# Patient Record
Sex: Female | Born: 1970 | Race: White | Hispanic: No | Marital: Married | Smoking: Never smoker
Health system: Southern US, Community
[De-identification: ages and names within clinical notes are randomized; demographics above are authoritative.]

## PROBLEM LIST (undated history)

## (undated) DIAGNOSIS — F419 Anxiety disorder, unspecified: Secondary | ICD-10-CM

## (undated) DIAGNOSIS — T4145XA Adverse effect of unspecified anesthetic, initial encounter: Secondary | ICD-10-CM

## (undated) DIAGNOSIS — J329 Chronic sinusitis, unspecified: Secondary | ICD-10-CM

## (undated) DIAGNOSIS — R51 Headache: Secondary | ICD-10-CM

## (undated) DIAGNOSIS — J45909 Unspecified asthma, uncomplicated: Secondary | ICD-10-CM

## (undated) DIAGNOSIS — K5090 Crohn's disease, unspecified, without complications: Secondary | ICD-10-CM

## (undated) DIAGNOSIS — T8859XA Other complications of anesthesia, initial encounter: Secondary | ICD-10-CM

## (undated) HISTORY — PX: VARICOSE VEIN SURGERY: SHX832

---

## 1998-02-21 ENCOUNTER — Ambulatory Visit (HOSPITAL_COMMUNITY): Admission: RE | Admit: 1998-02-21 | Discharge: 1998-02-21 | Payer: Self-pay | Admitting: Gastroenterology

## 1998-02-22 ENCOUNTER — Ambulatory Visit (HOSPITAL_COMMUNITY): Admission: RE | Admit: 1998-02-22 | Discharge: 1998-02-22 | Payer: Self-pay | Admitting: Gastroenterology

## 2002-02-01 ENCOUNTER — Other Ambulatory Visit: Admission: RE | Admit: 2002-02-01 | Discharge: 2002-02-01 | Payer: Self-pay | Admitting: Family Medicine

## 2004-10-30 ENCOUNTER — Other Ambulatory Visit: Admission: RE | Admit: 2004-10-30 | Discharge: 2004-10-30 | Payer: Self-pay | Admitting: Obstetrics and Gynecology

## 2005-03-07 ENCOUNTER — Inpatient Hospital Stay (HOSPITAL_COMMUNITY): Admission: AD | Admit: 2005-03-07 | Discharge: 2005-03-07 | Payer: Self-pay | Admitting: Obstetrics and Gynecology

## 2005-06-02 ENCOUNTER — Ambulatory Visit (HOSPITAL_COMMUNITY): Admission: RE | Admit: 2005-06-02 | Discharge: 2005-06-02 | Payer: Self-pay | Admitting: Obstetrics and Gynecology

## 2005-06-02 ENCOUNTER — Inpatient Hospital Stay (HOSPITAL_COMMUNITY): Admission: AD | Admit: 2005-06-02 | Discharge: 2005-06-02 | Payer: Self-pay | Admitting: Obstetrics and Gynecology

## 2005-06-06 ENCOUNTER — Inpatient Hospital Stay (HOSPITAL_COMMUNITY): Admission: AD | Admit: 2005-06-06 | Discharge: 2005-06-12 | Payer: Self-pay | Admitting: Obstetrics and Gynecology

## 2005-06-09 ENCOUNTER — Encounter (INDEPENDENT_AMBULATORY_CARE_PROVIDER_SITE_OTHER): Payer: Self-pay | Admitting: Specialist

## 2005-07-09 ENCOUNTER — Inpatient Hospital Stay (HOSPITAL_COMMUNITY): Admission: AD | Admit: 2005-07-09 | Discharge: 2005-07-09 | Payer: Self-pay | Admitting: Obstetrics and Gynecology

## 2006-04-30 ENCOUNTER — Other Ambulatory Visit: Admission: RE | Admit: 2006-04-30 | Discharge: 2006-04-30 | Payer: Self-pay | Admitting: Obstetrics and Gynecology

## 2006-10-07 ENCOUNTER — Encounter: Admission: RE | Admit: 2006-10-07 | Discharge: 2006-10-07 | Payer: Self-pay | Admitting: Family Medicine

## 2008-02-13 ENCOUNTER — Emergency Department (HOSPITAL_COMMUNITY): Admission: EM | Admit: 2008-02-13 | Discharge: 2008-02-13 | Payer: Self-pay | Admitting: Emergency Medicine

## 2008-02-14 ENCOUNTER — Encounter: Payer: Self-pay | Admitting: Pulmonary Disease

## 2008-02-14 ENCOUNTER — Encounter: Admission: RE | Admit: 2008-02-14 | Discharge: 2008-02-14 | Payer: Self-pay | Admitting: Family Medicine

## 2008-02-17 ENCOUNTER — Telehealth (INDEPENDENT_AMBULATORY_CARE_PROVIDER_SITE_OTHER): Payer: Self-pay | Admitting: *Deleted

## 2008-02-17 ENCOUNTER — Ambulatory Visit: Payer: Self-pay | Admitting: Pulmonary Disease

## 2008-02-17 DIAGNOSIS — J309 Allergic rhinitis, unspecified: Secondary | ICD-10-CM | POA: Insufficient documentation

## 2008-02-17 DIAGNOSIS — J383 Other diseases of vocal cords: Secondary | ICD-10-CM | POA: Insufficient documentation

## 2008-02-17 DIAGNOSIS — K5090 Crohn's disease, unspecified, without complications: Secondary | ICD-10-CM | POA: Insufficient documentation

## 2008-02-17 DIAGNOSIS — J45909 Unspecified asthma, uncomplicated: Secondary | ICD-10-CM | POA: Insufficient documentation

## 2008-02-18 ENCOUNTER — Telehealth (INDEPENDENT_AMBULATORY_CARE_PROVIDER_SITE_OTHER): Payer: Self-pay | Admitting: *Deleted

## 2008-02-18 ENCOUNTER — Encounter: Payer: Self-pay | Admitting: Pulmonary Disease

## 2008-02-21 ENCOUNTER — Telehealth: Payer: Self-pay | Admitting: Pulmonary Disease

## 2008-02-28 ENCOUNTER — Encounter: Admission: RE | Admit: 2008-02-28 | Discharge: 2008-04-12 | Payer: Self-pay | Admitting: Pulmonary Disease

## 2008-02-28 ENCOUNTER — Encounter: Payer: Self-pay | Admitting: Pulmonary Disease

## 2008-02-29 ENCOUNTER — Ambulatory Visit: Payer: Self-pay | Admitting: Pulmonary Disease

## 2008-03-03 ENCOUNTER — Telehealth (INDEPENDENT_AMBULATORY_CARE_PROVIDER_SITE_OTHER): Payer: Self-pay | Admitting: *Deleted

## 2008-03-14 ENCOUNTER — Telehealth: Payer: Self-pay | Admitting: Pulmonary Disease

## 2008-03-16 ENCOUNTER — Telehealth (INDEPENDENT_AMBULATORY_CARE_PROVIDER_SITE_OTHER): Payer: Self-pay | Admitting: *Deleted

## 2008-03-29 ENCOUNTER — Encounter: Admission: RE | Admit: 2008-03-29 | Discharge: 2008-03-29 | Payer: Self-pay | Admitting: Obstetrics and Gynecology

## 2008-04-04 ENCOUNTER — Ambulatory Visit: Payer: Self-pay | Admitting: Pulmonary Disease

## 2008-04-12 ENCOUNTER — Encounter: Payer: Self-pay | Admitting: Pulmonary Disease

## 2008-05-04 ENCOUNTER — Telehealth: Payer: Self-pay | Admitting: Pulmonary Disease

## 2008-06-27 ENCOUNTER — Encounter: Payer: Self-pay | Admitting: Pulmonary Disease

## 2008-07-04 ENCOUNTER — Ambulatory Visit: Payer: Self-pay | Admitting: Pulmonary Disease

## 2008-11-02 ENCOUNTER — Ambulatory Visit: Payer: Self-pay | Admitting: Pulmonary Disease

## 2008-11-03 ENCOUNTER — Ambulatory Visit (HOSPITAL_COMMUNITY): Admission: RE | Admit: 2008-11-03 | Discharge: 2008-11-03 | Payer: Self-pay | Admitting: Family Medicine

## 2009-01-17 ENCOUNTER — Ambulatory Visit: Payer: Self-pay | Admitting: Pulmonary Disease

## 2009-01-22 ENCOUNTER — Ambulatory Visit: Payer: Self-pay | Admitting: Vascular Surgery

## 2009-03-08 ENCOUNTER — Telehealth: Payer: Self-pay | Admitting: Pulmonary Disease

## 2009-04-02 ENCOUNTER — Ambulatory Visit: Payer: Self-pay | Admitting: Vascular Surgery

## 2009-05-02 ENCOUNTER — Ambulatory Visit (HOSPITAL_COMMUNITY): Admission: RE | Admit: 2009-05-02 | Discharge: 2009-05-02 | Payer: Self-pay | Admitting: Allergy and Immunology

## 2009-05-21 ENCOUNTER — Ambulatory Visit: Payer: Self-pay | Admitting: Vascular Surgery

## 2009-05-29 ENCOUNTER — Ambulatory Visit: Payer: Self-pay | Admitting: Vascular Surgery

## 2009-06-18 ENCOUNTER — Ambulatory Visit: Payer: Self-pay | Admitting: Vascular Surgery

## 2009-06-25 ENCOUNTER — Ambulatory Visit: Payer: Self-pay | Admitting: Vascular Surgery

## 2009-07-26 ENCOUNTER — Ambulatory Visit: Payer: Self-pay | Admitting: Vascular Surgery

## 2009-08-23 ENCOUNTER — Ambulatory Visit: Payer: Self-pay | Admitting: Vascular Surgery

## 2009-09-08 HISTORY — PX: SEPTOPLASTY: SHX2393

## 2009-10-18 ENCOUNTER — Ambulatory Visit: Payer: Self-pay | Admitting: Vascular Surgery

## 2009-10-24 ENCOUNTER — Ambulatory Visit (HOSPITAL_COMMUNITY): Admission: RE | Admit: 2009-10-24 | Discharge: 2009-10-24 | Payer: Self-pay | Admitting: Gastroenterology

## 2009-10-31 ENCOUNTER — Ambulatory Visit (HOSPITAL_COMMUNITY): Admission: RE | Admit: 2009-10-31 | Discharge: 2009-10-31 | Payer: Self-pay | Admitting: Gastroenterology

## 2009-11-14 ENCOUNTER — Ambulatory Visit (HOSPITAL_COMMUNITY): Admission: RE | Admit: 2009-11-14 | Discharge: 2009-11-14 | Payer: Self-pay | Admitting: Gastroenterology

## 2009-12-12 ENCOUNTER — Encounter (HOSPITAL_COMMUNITY): Admission: RE | Admit: 2009-12-12 | Discharge: 2010-03-12 | Payer: Self-pay | Admitting: Gastroenterology

## 2010-01-02 ENCOUNTER — Telehealth: Payer: Self-pay | Admitting: Pulmonary Disease

## 2010-01-14 ENCOUNTER — Telehealth: Payer: Self-pay | Admitting: Adult Health

## 2010-03-22 ENCOUNTER — Encounter (HOSPITAL_COMMUNITY): Admission: RE | Admit: 2010-03-22 | Discharge: 2010-04-05 | Payer: Self-pay | Admitting: Gastroenterology

## 2010-05-09 ENCOUNTER — Ambulatory Visit (HOSPITAL_COMMUNITY): Admission: RE | Admit: 2010-05-09 | Discharge: 2010-05-09 | Payer: Self-pay | Admitting: Gastroenterology

## 2010-10-08 NOTE — Progress Notes (Signed)
Summary: Alprazolam   Phone Note Refill Request   Refills Requested: Medication #1:  ALPRAZOLAM 0.25 MG TABS Take 1 tablet by mouth three times a day as needed   Last Refilled: 07/06/2009 Per Dr. Vassie Loll ok for refill this time with 0 refills, pt will need to arrange OV with TP for follow up  Initial call taken by: Zackery Barefoot CMA,  January 02, 2010 4:45 PM  Follow-up for Phone Call        Rx called to pharmacy Zackery Barefoot CMA  January 02, 2010 4:47 PM   ATC pt on home # no answer and unable to leave message. Will try again. Zackery Barefoot CMA  January 02, 2010 4:55 PM     Prescriptions: ALPRAZOLAM 0.25 MG TABS (ALPRAZOLAM) Take 1 tablet by mouth three times a day as needed  #90 x 0   Entered by:   Zackery Barefoot CMA   Authorized by:   Comer Locket. Vassie Loll MD   Signed by:   Zackery Barefoot CMA on 01/02/2010   Method used:   Telephoned to ...       [facility]* (retail)       [address]       [address] Shipping/mailing       [place], [place]  [address]       Ph: [phone]       Fax: [phone]   RxID:   [id number]   Appended Document: Alprazolam  Pt needs follow up appointment with TP.   Appended Document: Alprazolam  appointment scheduled

## 2010-10-08 NOTE — Progress Notes (Signed)
Summary: nos appt  Phone Note Call from Patient   Caller: Stacie Kaufman  Call For: Stacie Kaufman Summary of Call: LMTCB x 2 for nos 5/6 appt. Initial call taken by: Darletta Moll,  Jan 14, 2010 3:52 PM

## 2011-01-21 NOTE — Procedures (Signed)
DUPLEX DEEP VENOUS EXAM - LOWER EXTREMITY   INDICATION:  Follow up right greater saphenous vein graft laser  ablation.   HISTORY:  Edema:  No.  Trauma/Surgery:  Right greater saphenous vein graft laser ablation on  May 21, 2009.  Pain:  Right mid thigh tenderness.  PE:  No.  Previous DVT:  No.  Anticoagulants:  Other:   DUPLEX EXAM:                CFV   SFV   PopV  PTV    GSV                R  L  R  L  R  L  R   L  R  L  Thrombosis    o  o  o     o     O      +  Spontaneous   +  +  +     +     +      o  Phasic        +  +  +     +     +      o  Augmentation  +  +  +     +     +      o  Compressible  +  +  +     +     +      o  Competent     +  +  +     +     +      +   Legend:  + - yes  o - no  p - partial  D - decreased   IMPRESSION:  1. No evidence of deep venous thrombosis noted in the right lower      extremity.  2. Total occlusion of the right greater saphenous vein graft from the      distal insertion site to the proximal thigh level and at the mid      calf level.  3. Partial occlusion of the right greater saphenous vein graft noted      at the saphenofemoral junction level with thrombus extending 2 mm      into the common femoral vein lumen.  4. The right saphenofemoral junction is competent.    _____________________________  Quita Skye. Hart Rochester, M.D.   CH/MEDQ  D:  05/30/2009  T:  05/30/2009  Job:  [id number]

## 2011-01-21 NOTE — Assessment & Plan Note (Signed)
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  She has had 1 pregnancy 3-1/2 years ago and developed  thrombophlebitis of the right leg towards the end of her pregnancy and  also had recurrent thrombophlebitis a few months ago.  She has painful  varicosities in both legs involving the lower thigh and calf both  medially and laterally.  She has been treated with long-leg graduated  compression elastic stockings for the last 3-1/2 months as well as  elevation of her legs and ibuprofen on a daily basis.  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  She desires to be pregnant again but is concerned about further episodes  of DVT or phlebitis because of her history.   On examination, she continues to have bulging varicosities in the right  leg in the medial thigh and calf, in the lateral calf as well as in the  left pretibial region.  She has gross reflux in both great saphenous  veins from the saphenofemoral junction to the knee with normal deep  venous systems.   Because she has failed conservative treatment and continues to have  symptoms which are affecting her working and her daily living and she  wishes to get pregnant again, we think the best plan would be the  following schedule:  1)  Laser ablation of the right great saphenous  vein with multiple stab phlebectomies to be followed by 2 sessions of  sclerotherapy.  2)  Laser ablation of the left great saphenous vein to  be followed by 2 sessions of sclerotherapy with no stab phlebectomies on  the left.   We will proceed with precertification for this procedure to  be done in the near future.   Quita Skye Hart Rochester, M.D.  Electronically Signed   JDL/MEDQ  D:  04/02/2009  T:  04/03/2009  Job:  [id number]

## 2011-01-21 NOTE — Procedures (Signed)
DUPLEX DEEP VENOUS EXAM - LOWER EXTREMITY   INDICATION:  Follow up left greater saphenous vein ablation.   HISTORY:  Edema:  No  Trauma/Surgery:  Yes  Pain:  Yes  PE:  No  Previous DVT:  No  Anticoagulants:  Other:   DUPLEX EXAM:                CFV   SFV   PopV  PTV    GSV                R  L  R  L  R  L  R   L  R  L  Thrombosis    0  0     0     0      0     +  Spontaneous   +                           0  Phasic        +                           0  Augmentation  +                           0  Compressible  +                           0  Competent     +                           0   Legend:  + - yes  o - no  p - partial  D - decreased   IMPRESSION:  1. No evidence of deep vein thrombosis noted in the left leg.  2. The left greater saphenous vein appears ablated from the      saphenofemoral junction to below-knee level.    _____________________________  Quita Skye. Hart Rochester, M.D.   MG/MEDQ  D:  06/25/2009  T:  06/26/2009  Job:  [id number]

## 2011-01-21 NOTE — Assessment & Plan Note (Signed)
OFFICE VISIT   KHADIJAH, MASTRIANNI  DOB:  May 08, 1971                                       05/29/2009  ZOXWR#:[id number]   The patient had laser ablation of a right great saphenous vein with  multiple stab phlebectomies 1 week ago for painful varicosities.  She  has had some mild aching in the right thigh one would expect from the  laser ablation procedure but that is improving.  There is mild tightness  in this area but no distal edema.  She has some ecchymosis and bruising  over the stab phlebectomy sites in the calf.   She had a venous duplex exam in the office today which revealed total  closure of the right great saphenous vein from the insertion site up to  the saphenofemoral junction with partial occlusion at the level of the  SFJ.  There is some mild thrombus extending only 1-2 mm into the common  femoral vein with no evidence of deep venous obstruction.  There is no  reflux noted.   I am very pleased with her early result.  I have asked her to take one  81 mg aspirin tablet per day and she will return in 3 weeks for a  similar procedure on the left leg.   Quita Skye Hart Rochester, M.D.  Electronically Signed   JDL/MEDQ  D:  05/29/2009  T:  05/30/2009  Job:  [id number]

## 2011-01-21 NOTE — Procedures (Signed)
LOWER EXTREMITY VENOUS REFLUX EXAM   INDICATION:  Bilateral varicose veins, right lower extremity  thrombophlebitis.   EXAM:  Using color-flow imaging and pulse Doppler spectral analysis, the  right and left common femoral, superficial femoral, popliteal, posterior  tibial, greater and lesser saphenous veins are evaluated.  There is no  evidence suggesting deep venous insufficiency in the right and left  lower extremities.   The right and left saphenofemoral junctions are not competent.  The  right and left GSV's are not competent with the caliber as described  below.   The right and left proximal short saphenous vein demonstrates  competency.   GSV Diameter (used if found to be incompetent only)                                            Right    Left  Proximal Greater Saphenous Vein           0.95 cm  0.49 cm  Proximal-to-mid-thigh                     cm       cm  Mid thigh                                 0.56 cm  0.42 cm  Mid-distal thigh                          cm       cm  Distal thigh                              0.57 cm  0.55 cm  Knee                                      0.60 cm  0.48 cm   IMPRESSION:  1. Right greater saphenous vein reflux is identified with the caliber      ranging from 0.56 cm to 0.95 cm knee to groin.  The left from 0.42      cm to 0.55 cm.  2. The right greater saphenous vein in the calf shows evidence of      chronic superficial venous thrombosis with a varicosity with      subacute thrombus noted in the calf as well.  3. The right and left greater saphenous veins are not aneurysmal.  4. The right and left greater saphenous veins are not tortuous.  5. The deep venous system is competent.  6. The right and left lesser saphenous veins are competent.   ___________________________________________  Quita Skye. Hart Rochester, M.D.   AS/MEDQ  D:  01/22/2009  T:  01/22/2009  Job:  [id number]

## 2011-01-21 NOTE — Procedures (Signed)
DUPLEX DEEP VENOUS EXAM - LOWER EXTREMITY   INDICATION:  Follow up right leg thrombophlebitis.   HISTORY:  Edema:  No  Trauma/Surgery:  No  Pain:  Yes  PE:  No  Previous DVT:  No  Anticoagulants:  None  Other:   DUPLEX EXAM:                CFV   SFV   PopV  PTV    GSV                R  L  R  L  R  L  R   L  R  L  Thrombosis    o  o  o  o  o  o  o   o  o  o  Spontaneous   +     +     +     +      +  Phasic        +     +     +     +      +  Augmentation  +  +  +     +     +      +  Compressible  +     +     +     +      +  Competent     +     +     +     +      +   Legend:  + - yes  o - no  p - partial  D - decreased   IMPRESSION:  No evidence of deep vein thrombosis noted in bilateral  legs.  Reflux noted in bilateral greater saphenous vein and thrombus noted in  the right greater saphenous vein branch at the calf level.    _____________________________  Quita Skye. Hart Rochester, M.D.   MG/MEDQ  D:  04/02/2009  T:  04/03/2009  Job:  [id number]

## 2011-01-21 NOTE — Assessment & Plan Note (Signed)
OFFICE VISIT   Stacie, Kaufman  DOB:  11/07/1970                                       06/25/2009  ZOXWR#:[id number]   This patient returns today 1 week post laser ablation of her left great  saphenous vein for painful varicosities.  She states that she has been  having some aching discomfort at the site of the laser ablation in her  medial thigh which continues to improve.  She also states that she has  had no distal edema in the left ankle and foot area.  She is wearing  elastic compression stockings which is decreasing her discomfort.   On examination, her left leg has palpable arterial pulses down to the  dorsalis pedis level at  3+.  She has some mild ecchymosis along the  medial aspect of left thigh from the knee to the mid thigh.  She has  some mild tenderness palpating the great saphenous vein.  She has no  distal edema.  She is alert and oriented.  Neurologic exam is normal.   Venous duplex exam was performed today which reveals total ablation of  the left great saphenous vein from the saphenofemoral junction to the  proximal calf.  There is no evidence of deep venous obstruction.   She is reassured regarding these findings.  She will return for 2  scheduled courses of sclerotherapy in the left leg to complete the  treatment session.   Quita Skye Hart Rochester, M.D.  Electronically Signed   JDL/MEDQ  D:  06/25/2009  T:  06/26/2009  Job:  [id number]

## 2011-01-21 NOTE — Consult Note (Signed)
NEW PATIENT CONSULTATION   Stacie Kaufman, Stacie Kaufman  DOB:  June 15, 1971                                       01/22/2009  ZOXWR#:[id number]   The patient is a healthy 40 year old female with a recent history of  recurrent thrombophlebitis in the right calf and greater saphenous  varicosities.  This patient had her initial episode of thrombophlebitis  at the very end of her only pregnancy 3-1/2 years ago, which again  involved the right medial calf area.  This resolved following pregnancy  and 6 weeks ago she had a second episode of thrombophlebitis which is in  the resolving stage.  She has been wearing bilateral long-leg elastic  compression stockings (20-mm) for the last 6 weeks and tries to elevate  her legs as much as possible and take pain medication but continues to  have discomfort in both legs, both in the thigh and calf on the right  and the thigh and calf on the left.  She has no distal edema or history  of deep venous thrombosis, stasis ulcers or bleeding.  She is  contemplating a second pregnancy and is concerned about further episodes  of thrombophlebitis or DVT because of her venous insufficiency and is  also concerned about her symptomatology.   PAST MEDICAL HISTORY:  1. Crohn's disease.  2. Vocal cord disorder.  3. Asthma.  4. Negative for:      a.     Hypertension.      b.     Coronary artery disease.      c.     COPD.      d.     Hyperlipidemia.      e.     Stroke.   FAMILY HISTORY:  Positive for coronary artery disease in her father who  had a myocardial infarction.  Negative for diabetes and stroke.   SOCIAL HISTORY:  She is married, has 1 child, works as a Administrator, [employer].  She does not use tobacco, drinks occasional alcohol.   REVIEW OF SYSTEMS:  Unremarkable.   ALLERGIES:  1. SULFA.  2. TETRACYCLINE.   MEDICATIONS:  Please see health history form.   PHYSICAL EXAMINATION:  Blood pressure is 120/78, heart rate is 82,  respirations  14.  GENERAL:  She is a healthy-appearing female in no apparent distress,  alert and oriented x3.  NECK:  Supple, 3+ carotid pulses palpable.  No bruits are audible.  NEUROLOGIC:  Normal.  No palpable adenopathy in the neck.  CHEST:  Clear to auscultation.  CARDIOVASCULAR:  A regular rhythm with no murmurs.  ABDOMEN:  Soft, nontender with no masses.  EXTREMITIES:  She has 3+ femoral, popliteal and dorsalis pedis pulses  bilaterally.  Both feet are well-perfused.  She has a patch of resolving  thrombophlebitis in the right calf over some greater saphenous  varicosities with a large pattern of reticular spider veins emanating  from this and some other varicosities in the distal thigh.  There is no  hyperpigmentation or ulceration distally.  Left leg has a pattern of  greater saphenous varicosities beginning at the knee, extending along  the greater saphenous system in the medial calf with no distal edema.   Venous duplex exam reveals both great saphenous veins to have gross  reflux up to and including the saphenofemoral junction with the vein on  the right being slightly bigger than on the left.  The deep systems are  normal bilaterally with no deep venous obstruction and there is  resolving thrombophlebitis in the right calf varicosities.   I think we should treat her initially with long-leg elastic compression  stockings for another 2 months as well as elevation and analgesics,  which she is doing already.  At that time I will see her back and see  how the thrombophlebitis is progressing.  We will also rescan her legs  to look at the caliber of her great saphenous veins bilaterally to see  if she would be a candidate for laser ablation of great saphenous  systems with stab phlebectomies and possible sclerotherapy on the right.  She will return in 2 months.   Quita Skye Hart Rochester, M.D.  Electronically Signed   JDL/MEDQ  D:  01/22/2009  T:  01/23/2009  Job:  [id number]   cc:   Duncan Dull,  M.D.  Emeterio Reeve, MD

## 2011-01-24 NOTE — Discharge Summary (Signed)
**Note Stacie Kaufman** Stacie Kaufman                 ACCOUNT NO.:  [id number]   MEDICAL RECORD NO.:  [id number]          PATIENT TYPE:  INP   LOCATION:  9133                          FACILITY:  [facility]   PHYSICIAN:  Crist Fat. Rivard, M.D. DATE OF BIRTH:  [birth year]   DATE OF ADMISSION:  06/06/2005  DATE OF DISCHARGE:                                 DISCHARGE SUMMARY   ADMITTING DIAGNOSIS:  Intrauterine pregnancy at 38-1/2 weeks,  oligohydramnios and superficial venous thrombus in the right leg. Failure of  descent and nonreassuring fetal heart rate tracing.   PROCEDURE:  Primary low transverse cesarean section.   DISCHARGE DIAGNOSIS:  1.  Intrauterine pregnancy at 38-1/2 weeks, oligohydramnios and superficial      venous thrombus in the right leg. Failure of descent and nonreassuring      fetal heart rate tracing.  2.  Primary low transverse cesarean section.   HISTORY OF PRESENT ILLNESS:  Stacie Kaufman is a 40 year old gravida 1, para 0  who presented at 38-1/2 weeks with an AFI of 3.5 for induction of labor  secondary to oligohydramnios. Her labor was stimulated with Pitocin. And she  progressed without incident to complete dilation. At that point she did  develop fetal heart rate decelerations and although pushing for 2 hours  brought the vertex to a +2 station. Dr. Su Hilt was consulted and discussed  the options with the patient and her husband. Decision was made for primary  low transverse cesarean section that was accomplished on June 09, 2005  with the birth of the 7 pounds 10 ounces female infant with Apgar scores of  9 at 1 minute and 9 at 5 minutes. The patient has done well in the  postoperative period. Her incision is clean, dry and intact. Her vital signs  have remained stable. She was febrile in the first 24 hours and received a  Unasyn IV which was effective. On this her third postoperative day she is  judged to be in satisfactory condition for discharge.   DISCHARGE INSTRUCTIONS:   [facility] discharge instructions.   DISCHARGE MEDICATIONS:  1.  Anaprox DS 550 milligrams p.o. b.i.d.  2.  Tylox one to two p.o. q. 3-4 hours p.r.n. pain  3.  Medications as previously ordered prior to pregnancy for her Crohn's      disease as the patient had already been taking.   FOLLOW UP:  She will follow up at the office of [facility] in 6 weeks.      Rica Koyanagi, C.N.M.      Crist Fat Rivard, M.D.  Electronically Signed    SDM/MEDQ  D:  06/12/2005  T:  06/12/2005  Job:  [id number]
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
## 2011-01-24 NOTE — Op Note (Signed)
NAMEALLANNA, BRESEE                 ACCOUNT NO.:  [id number]   MEDICAL RECORD NO.:  [id number]          PATIENT TYPE:  INP   LOCATION:  9133                          FACILITY:  [facility]   PHYSICIAN:  Osborn Coho, M.D.   DATE OF BIRTH:  10/25/1970   DATE OF PROCEDURE:  06/09/2005  DATE OF DISCHARGE:                                 OPERATIVE REPORT   PREOPERATIVE DIAGNOSIS.:  1.  Term intrauterine pregnancy.  2.  Failure of descent.  3.  Nonreassuring fetal heart tracing.   POSTOPERATIVE DIAGNOSIS:  1.  Term intrauterine pregnancy.  2.  Failure of descent.  3.  Nonreassuring fetal heart tracing.   PROCEDURE:  Primary low transverse C-section via Pfannenstiel skin incision.   ANESTHESIA:  Epidural.   ATTENDING:  Osborn Coho, M.D.   ASSISTANT:  Elby Showers. Williams, C.N.M.   FLUIDS:  700 mL.   URINE OUTPUT:  250 mL.   ESTIMATED BLOOD LOSS:  600 mL.   COMPLICATIONS:  None.   FINDINGS:  Live female infant with Apgars of 9 at one minute and 9 at five  minutes.  Normal appearing bilateral ovaries and fallopian tubes.   PROCEDURE:  The patient was taken to the operating room and after risks,  benefits, alternatives reviewed with the patient the patient verbalized  understanding and consent signed and witnessed. The patient was given a  surgical level via the epidural and prepped and draped in normal sterile  fashion in the dorsal supine position with a leftward tilt. A Pfannenstiel  skin incision was made and carried down to the underlying layer of fascia  with the Bovie. The fascia was excised bilaterally in the midline, extended  bilaterally with the Mayo scissors. Kocher clamps placed on the inferior  aspect of the fascial incision and the rectus muscle excised from the  fascia. The same was done on the superior aspect of the fascial incision.   The rectus muscle was separated in midline and the peritoneum entered and  bluntly and extended manually. The bladder blade was  placed and the bladder  flap created with the Metzenbaum scissors. The uterine incision was made  with the scalpel, extended bilaterally with the bandage scissors. The infant  was delivered and the oral and nasopharynx were bulb suctioned. The  remainder of the infant was delivered and cord clamped and cut. Cefoxitin  was administered. The placenta was removed via fundal massage. The uterus  was cleared of all clots and debris. The incision was repaired with 0 Vicryl  in a running locked fashion and a second imbricating layer was performed.  There was bleeding at the right aspect of the uterine incision and a figure-  of-eight was placed for hemostasis. Adnexal findings as noted above. The  intra-abdominal cavity was irrigated. The fascia was repaired with zero  Vicryl in a running fashion. The subcutaneous tissue was irrigated and made  hemostatic with the Bovie. The skin was reapproximated using a subcuticular  stitch of 4-0 Vicryl. Sponge, lap, and needle count was correct. The patient  tolerated procedure well and is awaiting transfer to  the recovery room in  good condition.      Osborn Coho, M.D.  Electronically Signed     AR/MEDQ  D:  06/09/2005  T:  06/09/2005  Job:  [id number]

## 2011-06-05 LAB — DIFFERENTIAL
Basophils Absolute: 0
Basophils Relative: 1
Eosinophils Absolute: 0
Eosinophils Relative: 1
Lymphs Abs: 1.7
Monocytes Absolute: 0.4
Monocytes Relative: 7

## 2011-06-05 LAB — CBC: WBC: 5.5

## 2011-06-05 LAB — POCT I-STAT, CHEM 8
BUN: 6
Chloride: 108
Sodium: 138

## 2011-06-05 LAB — D-DIMER, QUANTITATIVE: D-Dimer, Quant: 0.47

## 2011-06-05 LAB — POCT PREGNANCY, URINE: Preg Test, Ur: NEGATIVE

## 2011-12-03 ENCOUNTER — Ambulatory Visit (INDEPENDENT_AMBULATORY_CARE_PROVIDER_SITE_OTHER): Payer: 59 | Admitting: Obstetrics and Gynecology

## 2011-12-03 DIAGNOSIS — Z01419 Encounter for gynecological examination (general) (routine) without abnormal findings: Secondary | ICD-10-CM

## 2012-08-08 DIAGNOSIS — J329 Chronic sinusitis, unspecified: Secondary | ICD-10-CM

## 2012-08-08 HISTORY — DX: Chronic sinusitis, unspecified: J32.9

## 2012-08-10 ENCOUNTER — Emergency Department (HOSPITAL_COMMUNITY)
Admission: EM | Admit: 2012-08-10 | Discharge: 2012-08-10 | Disposition: A | Discharge status: Home / Self Care | Payer: 59 | Attending: Emergency Medicine | Admitting: Emergency Medicine

## 2012-08-10 ENCOUNTER — Emergency Department (HOSPITAL_COMMUNITY): Payer: 59

## 2012-08-10 DIAGNOSIS — R6883 Chills (without fever): Secondary | ICD-10-CM | POA: Insufficient documentation

## 2012-08-10 DIAGNOSIS — J329 Chronic sinusitis, unspecified: Secondary | ICD-10-CM | POA: Insufficient documentation

## 2012-08-10 DIAGNOSIS — Z79899 Other long term (current) drug therapy: Secondary | ICD-10-CM | POA: Insufficient documentation

## 2012-08-10 DIAGNOSIS — F411 Generalized anxiety disorder: Secondary | ICD-10-CM | POA: Insufficient documentation

## 2012-08-10 DIAGNOSIS — T361X5A Adverse effect of cephalosporins and other beta-lactam antibiotics, initial encounter: Secondary | ICD-10-CM | POA: Insufficient documentation

## 2012-08-10 DIAGNOSIS — R61 Generalized hyperhidrosis: Secondary | ICD-10-CM | POA: Insufficient documentation

## 2012-08-10 DIAGNOSIS — R197 Diarrhea, unspecified: Secondary | ICD-10-CM | POA: Insufficient documentation

## 2012-08-10 DIAGNOSIS — R231 Pallor: Secondary | ICD-10-CM | POA: Insufficient documentation

## 2012-08-10 DIAGNOSIS — R112 Nausea with vomiting, unspecified: Secondary | ICD-10-CM | POA: Insufficient documentation

## 2012-08-10 LAB — COMPREHENSIVE METABOLIC PANEL
AST: 18 U/L (ref 0–37)
BUN: 21 mg/dL (ref 6–23)
CO2: 23 mEq/L (ref 19–32)
Calcium: 9 mg/dL (ref 8.4–10.5)
Creatinine, Ser: 0.65 mg/dL (ref 0.50–1.10)
GFR calc non Af Amer: >90 mL/min (ref >90)

## 2012-08-10 LAB — URINALYSIS, ROUTINE W REFLEX MICROSCOPIC
Bilirubin Urine: NEGATIVE
Glucose, UA: NEGATIVE mg/dL
Ketones, ur: 15 mg/dL — AB
Leukocytes, UA: NEGATIVE
Nitrite: NEGATIVE
Protein, ur: NEGATIVE mg/dL

## 2012-08-10 LAB — CBC WITH DIFFERENTIAL/PLATELET
Basophils Absolute: 0 10*3/uL (ref 0.0–0.1)
Eosinophils Absolute: 0.1 10*3/uL (ref 0.0–0.7)
Eosinophils Relative: 1 % (ref 0–5)
MCH: 30.3 pg (ref 26.0–34.0)
MCV: 89.4 fL (ref 78.0–100.0)
Platelets: 247 10*3/uL (ref 150–400)
RDW: 12.5 % (ref 11.5–15.5)

## 2012-08-10 LAB — LIPASE, BLOOD: Lipase: 52 U/L (ref 11–59)

## 2012-08-10 MED ORDER — SODIUM CHLORIDE 0.9 % IV BOLUS (SEPSIS)
1000.0000 mL | Freq: Once | INTRAVENOUS | Status: AC
  Administered 2012-08-10: 1000 mL via INTRAVENOUS

## 2012-08-10 MED ORDER — ONDANSETRON HCL 4 MG/2ML IJ SOLN
4.0000 mg | Freq: Once | INTRAMUSCULAR | Status: AC
  Administered 2012-08-10: 4 mg via INTRAVENOUS
  Filled 2012-08-10: qty 2

## 2012-08-10 MED ORDER — ONDANSETRON HCL 8 MG PO TABS: 8.0000 mg | ORAL_TABLET | ORAL | Status: AC | PRN

## 2012-08-10 NOTE — ED Provider Notes (Signed)
History     CSN: [id number]  Arrival date & time 08/10/12  1341   First MD Initiated Contact with Patient 08/10/12 1431      Chief Complaint  Patient presents with  . Allergic Reaction    (Consider location/radiation/quality/duration/timing/severity/associated sxs/prior treatment) HPI Comments: 41 year old female with a history of anxiety and sinusitis presents emergency department with multiple complaints.  Patient reports that she was started on Omnicef for sinusitis earlier today with her first dose at 8 a.m.  Later round 11 a.m. she developed nausea, vomiting, and diarrhea.  Other associated symptoms include diaphoresis and chills.  EMS was called and reports that patient was found shivering, diaphoretic, cyanotic and hypotensive.  They placed patient on oxygen and gave fluid bolus (500 cc) and Zofran.  Patient denies any abdominal pain, blood in her bowel movements, syncope, chest pain, shortness of breath, cough, hemoptysis, leg swelling, dysuria, urinary frequency or hematuria.  Patient is a 41 y.o. female presenting with allergic reaction. The history is provided by the patient.  Allergic Reaction The primary symptoms are  nausea, vomiting and diarrhea. The primary symptoms do not include shortness of breath, abdominal pain, dizziness or rash.    No past medical history on file.  No past surgical history on file.  No family history on file.  History  Substance Use Topics  . Smoking status: Not on file  . Smokeless tobacco: Not on file  . Alcohol Use: Not on file    OB History    No data available      Review of Systems  Constitutional: Positive for chills. Negative for fever and appetite change.  HENT: Negative for congestion.   Eyes: Negative for visual disturbance.  Respiratory: Negative for shortness of breath.   Cardiovascular: Negative for chest pain and leg swelling.  Gastrointestinal: Positive for nausea, vomiting and diarrhea. Negative for abdominal pain,  constipation, blood in stool, abdominal distention and anal bleeding.  Genitourinary: Negative for dysuria, urgency and frequency.  Skin: Positive for color change and pallor. Negative for rash and wound.  Neurological: Negative for dizziness, syncope, weakness, light-headedness, numbness and headaches.  Psychiatric/Behavioral: Negative for confusion.  All other systems reviewed and are negative.    Allergies  Omnicef; Sulfonamide derivatives; and Tetracycline  Home Medications   Current Outpatient Rx  Name  Route  Sig  Dispense  Refill  . ACETAMINOPHEN 500 MG PO TABS   Oral   Take 500 mg by mouth every 6 (six) hours as needed. Pain         . ALBUTEROL SULFATE HFA 108 (90 BASE) MCG/ACT IN AERS   Inhalation   Inhale 2 puffs into the lungs as needed. Before exercise         . ALPRAZOLAM 0.25 MG PO TABS   Oral   Take 0.25 mg by mouth at bedtime as needed. Anxiety         . B-COMPLEX PO   Oral   Take 1 tablet by mouth daily.         . BUPROPION HCL ER (XL) 150 MG PO TB24   Oral   Take 150 mg by mouth daily.         Marland Kitchen VITAMIN D 1000 UNITS PO TABS   Oral   Take 2,000 Units by mouth daily.         Marland Kitchen CICLESONIDE 50 MCG/ACT NA SUSP   Each Nare   Place 2 sprays into both nostrils daily.         Marland Kitchen  CLONAZEPAM 0.5 MG PO TABS   Oral   Take 0.25 mg by mouth 2 (two) times daily as needed. Take 1/2 tablet every evening 5 days/week         . CYCLOSPORINE 0.05 % OP EMUL   Both Eyes   Place 1 drop into both eyes 2 (two) times daily.         Marland Kitchen ESCITALOPRAM OXALATE 10 MG PO TABS   Oral   Take 10 mg by mouth every evening.         Marland Kitchen FLUTICASONE PROPIONATE  HFA 110 MCG/ACT IN AERO   Inhalation   Inhale 2 puffs into the lungs every evening.         Marland Kitchen LEVOCETIRIZINE DIHYDROCHLORIDE 5 MG PO TABS   Oral   Take 5 mg by mouth daily.         Marland Kitchen NAPROXEN 500 MG PO TABS   Oral   Take 500 mg by mouth 2 (two) times daily as needed. pain         . NORETHIN-ETH  ESTRAD TRIPHASIC 0.5/0.75/1-35 MG-MCG PO TABS   Oral   Take 1 tablet by mouth daily.         Marland Kitchen ALIGN 4 MG PO CAPS   Oral   Take 1 capsule by mouth daily.         Marland Kitchen PSEUDOEPHEDRINE HCL 60 MG PO TABS   Oral   Take 60 mg by mouth every 4 (four) hours as needed.         Marland Kitchen VITAMIN B-12 1000 MCG PO TABS   Oral   Take 2,000 mcg by mouth daily. Take two tablets once daily         . ZOLPIDEM TARTRATE ER 6.25 MG PO TBCR   Oral   Take 6.25 mg by mouth at bedtime as needed. sleep           BP 88/58  Pulse 81  Temp 97.8 F (36.6 C) (Oral)  SpO2 100%  LMP 08/02/2012  Physical Exam  Nursing note and vitals reviewed. Constitutional: She is oriented to person, place, and time. She appears well-developed and well-nourished. No distress.       BP 88/58  Pulse 81  Temp 97.8 F (36.6 C) (Oral)  SpO2 100%  LMP 08/02/2012   HENT:  Head: Normocephalic and atraumatic.  Mouth/Throat: Oropharynx is clear and moist. No oropharyngeal exudate.  Eyes: Conjunctivae normal and EOM are normal. Pupils are equal, round, and reactive to light. No scleral icterus.  Neck: Normal range of motion. Neck supple. No tracheal deviation present. No thyromegaly present.  Cardiovascular: Normal rate, regular rhythm, normal heart sounds and intact distal pulses.   Pulmonary/Chest: Effort normal and breath sounds normal. No stridor. No respiratory distress. She has no wheezes.  Abdominal: Soft. There is no tenderness.  Musculoskeletal: Normal range of motion. She exhibits no edema and no tenderness.  Neurological: She is alert and oriented to person, place, and time. Coordination normal.  Skin: Skin is warm and dry. No rash noted. She is not diaphoretic. No erythema. There is pallor.  Psychiatric: She has a normal mood and affect. Her behavior is normal.    ED Course  Procedures (including critical care time)  Labs Reviewed  GLUCOSE, CAPILLARY - Abnormal; Notable for the following:     Glucose-Capillary 110 (*)     All other components within normal limits  CBC WITH DIFFERENTIAL  URINALYSIS, ROUTINE W REFLEX MICROSCOPIC  COMPREHENSIVE METABOLIC PANEL  LIPASE,  BLOOD  LACTIC ACID, PLASMA   No results found.   No diagnosis found.   BP 88/58  Pulse 81  Temp 97.8 F (36.6 C) (Oral)  SpO2 100%  LMP 08/02/2012  MDM  Patient reports emergency department with nausea, vomiting and chills after taking Omnicef.  Patient states she's had similar reaction after taking Omnicef for her chronic sinusitis.  IV fluids and antibiotics given in emergency department.  Patient in no acute distress and nontoxic appearing prior to discharge.  Origin gastritis versus adverse reaction to antibiotics.  Advised followup with primary care physician and discontinuation of antibiotic until evaluated by primary care.  Patient is agreeable with plan and will be given antibiotic prescription at discharge.        Jaci Carrel, [place] 08/10/12 1805

## 2012-08-10 NOTE — ED Notes (Signed)
Bed:[facility]<BR> Expected date:<BR> Expected time:<BR> Means of arrival:<BR> Comments:<BR> ems

## 2012-08-10 NOTE — ED Notes (Signed)
Per [facility], pt was initially hypotensive with circumoral cynosis. Pt reaction began at approximately 11:00AM to [facility] (using for sinusitis). N/V at approximately 11:30. Pt reports previous reaction, but more severe this time; diarrhea last time but not this time. New side effects include pale color, hypotensive episode, and cyanosis. Pt received 500cc bolus and Zofran 4mg  IV. 20g IV in LFA. O2 3L via [place]. Cyanosis did improve following O2 Admin. Pt c/o of being very cold with shivering with diaphoresis.

## 2012-08-10 NOTE — ED Notes (Signed)
Pt notified that urine is needed

## 2012-08-11 ENCOUNTER — Other Ambulatory Visit: Payer: Self-pay | Admitting: Otolaryngology

## 2012-08-12 ENCOUNTER — Ambulatory Visit (HOSPITAL_COMMUNITY)
Admission: RE | Admit: 2012-08-12 | Discharge: 2012-08-12 | Disposition: A | Discharge status: Home / Self Care | Payer: 59 | Source: Ambulatory Visit | Attending: Otolaryngology | Admitting: Otolaryngology

## 2012-08-12 ENCOUNTER — Other Ambulatory Visit: Payer: Self-pay | Admitting: Otolaryngology

## 2012-08-12 ENCOUNTER — Other Ambulatory Visit (HOSPITAL_COMMUNITY): Payer: Self-pay | Admitting: Otolaryngology

## 2012-08-12 DIAGNOSIS — J329 Chronic sinusitis, unspecified: Secondary | ICD-10-CM | POA: Insufficient documentation

## 2012-08-12 DIAGNOSIS — J310 Chronic rhinitis: Secondary | ICD-10-CM

## 2012-08-12 NOTE — ED Provider Notes (Signed)
Medical screening examination/treatment/procedure(s) were performed by non-physician practitioner and as supervising physician I was immediately available for consultation/collaboration.   Laray Anger, DO 08/12/12 1537

## 2012-08-13 ENCOUNTER — Other Ambulatory Visit: Payer: 59

## 2012-08-19 ENCOUNTER — Encounter (HOSPITAL_BASED_OUTPATIENT_CLINIC_OR_DEPARTMENT_OTHER): Payer: Self-pay | Admitting: *Deleted

## 2012-08-20 ENCOUNTER — Encounter (HOSPITAL_BASED_OUTPATIENT_CLINIC_OR_DEPARTMENT_OTHER): Payer: Self-pay | Admitting: *Deleted

## 2012-08-24 ENCOUNTER — Ambulatory Visit (HOSPITAL_BASED_OUTPATIENT_CLINIC_OR_DEPARTMENT_OTHER)
Admission: RE | Admit: 2012-08-24 | Discharge: 2012-08-24 | Disposition: A | Discharge status: Home / Self Care | Payer: 59 | Source: Ambulatory Visit | Attending: Otolaryngology | Admitting: Otolaryngology

## 2012-08-24 ENCOUNTER — Ambulatory Visit (HOSPITAL_BASED_OUTPATIENT_CLINIC_OR_DEPARTMENT_OTHER): Payer: 59 | Admitting: Anesthesiology

## 2012-08-24 ENCOUNTER — Encounter (HOSPITAL_BASED_OUTPATIENT_CLINIC_OR_DEPARTMENT_OTHER): Payer: Self-pay

## 2012-08-24 ENCOUNTER — Encounter (HOSPITAL_BASED_OUTPATIENT_CLINIC_OR_DEPARTMENT_OTHER)
Admission: RE | Disposition: A | Discharge status: Home / Self Care | Payer: Self-pay | Source: Ambulatory Visit | Attending: Otolaryngology

## 2012-08-24 ENCOUNTER — Encounter (HOSPITAL_BASED_OUTPATIENT_CLINIC_OR_DEPARTMENT_OTHER): Payer: Self-pay | Admitting: Anesthesiology

## 2012-08-24 DIAGNOSIS — J329 Chronic sinusitis, unspecified: Secondary | ICD-10-CM | POA: Insufficient documentation

## 2012-08-24 HISTORY — DX: Unspecified asthma, uncomplicated: J45.909

## 2012-08-24 HISTORY — DX: Chronic sinusitis, unspecified: J32.9

## 2012-08-24 HISTORY — DX: Headache: R51

## 2012-08-24 HISTORY — DX: Other complications of anesthesia, initial encounter: T88.59XA

## 2012-08-24 HISTORY — PX: SINUS ENDO W/FUSION: SHX777

## 2012-08-24 HISTORY — DX: Crohn's disease, unspecified, without complications: K50.90

## 2012-08-24 HISTORY — DX: Adverse effect of unspecified anesthetic, initial encounter: T41.45XA

## 2012-08-24 HISTORY — DX: Anxiety disorder, unspecified: F41.9

## 2012-08-24 LAB — POCT HEMOGLOBIN-HEMACUE: Hemoglobin: 14.7 g/dL (ref 12.0–15.0)

## 2012-08-24 SURGERY — SINUS SURGERY, ENDOSCOPIC, USING COMPUTER-ASSISTED NAVIGATION
Anesthesia: General | Site: Nose | Wound class: Dirty or Infected

## 2012-08-24 MED ORDER — OXYMETAZOLINE HCL 0.05 % NA SOLN
NASAL | Status: DC | PRN
  Administered 2012-08-24: 1 via NASAL

## 2012-08-24 MED ORDER — SODIUM CHLORIDE 0.9 % IR SOLN
Status: DC | PRN
  Administered 2012-08-24: 400 mL

## 2012-08-24 MED ORDER — GLYCOPYRROLATE 0.2 MG/ML IJ SOLN
INTRAMUSCULAR | Status: DC | PRN
  Administered 2012-08-24: 0.4 mg via INTRAVENOUS

## 2012-08-24 MED ORDER — FENTANYL CITRATE 0.05 MG/ML IJ SOLN
INTRAMUSCULAR | Status: DC | PRN
  Administered 2012-08-24: 100 ug via INTRAVENOUS
  Administered 2012-08-24 (×2): 25 ug via INTRAVENOUS

## 2012-08-24 MED ORDER — ONDANSETRON HCL 4 MG/2ML IJ SOLN
INTRAMUSCULAR | Status: DC | PRN
  Administered 2012-08-24: 4 mg via INTRAVENOUS

## 2012-08-24 MED ORDER — DEXAMETHASONE SODIUM PHOSPHATE 4 MG/ML IJ SOLN
INTRAMUSCULAR | Status: DC | PRN
  Administered 2012-08-24: 10 mg via INTRAVENOUS

## 2012-08-24 MED ORDER — PROPOFOL 10 MG/ML IV BOLUS
INTRAVENOUS | Status: DC | PRN
  Administered 2012-08-24: 150 mg via INTRAVENOUS

## 2012-08-24 MED ORDER — HYDROMORPHONE HCL PF 1 MG/ML IJ SOLN
0.2500 mg | INTRAMUSCULAR | Status: DC | PRN
  Administered 2012-08-24 (×2): 0.25 mg via INTRAVENOUS

## 2012-08-24 MED ORDER — LACTATED RINGERS IV SOLN
INTRAVENOUS | Status: DC
  Administered 2012-08-24 (×2): via INTRAVENOUS

## 2012-08-24 MED ORDER — LIDOCAINE HCL (CARDIAC) 20 MG/ML IV SOLN
INTRAVENOUS | Status: DC | PRN
  Administered 2012-08-24: 50 mg via INTRAVENOUS

## 2012-08-24 MED ORDER — LIDOCAINE-EPINEPHRINE 1 %-1:100000 IJ SOLN
INTRAMUSCULAR | Status: DC | PRN
  Administered 2012-08-24: 4 mL

## 2012-08-24 MED ORDER — ROCURONIUM BROMIDE 100 MG/10ML IV SOLN
INTRAVENOUS | Status: DC | PRN
  Administered 2012-08-24: 10 mg via INTRAVENOUS
  Administered 2012-08-24: 30 mg via INTRAVENOUS

## 2012-08-24 MED ORDER — MIDAZOLAM HCL 5 MG/5ML IJ SOLN
INTRAMUSCULAR | Status: DC | PRN
  Administered 2012-08-24: 2 mg via INTRAVENOUS

## 2012-08-24 MED ORDER — HYDROCODONE-ACETAMINOPHEN 5-325 MG PO TABS
1.0000 | ORAL_TABLET | Freq: Four times a day (QID) | ORAL | Status: AC | PRN
Start: 2012-08-24 — End: ?

## 2012-08-24 MED ORDER — NEOSTIGMINE METHYLSULFATE 1 MG/ML IJ SOLN
INTRAMUSCULAR | Status: DC | PRN
  Administered 2012-08-24: 3 mg via INTRAVENOUS

## 2012-08-24 MED ORDER — HYDROCODONE-ACETAMINOPHEN 5-325 MG PO TABS
1.0000 | ORAL_TABLET | Freq: Once | ORAL | Status: AC | PRN
  Administered 2012-08-24: 1 via ORAL

## 2012-08-24 SURGICAL SUPPLY — 67 items
BLADE RAD40 ROTATE 4M 4 5PK (BLADE) IMPLANT
BLADE RAD60 ROTATE M4 4 5PK (BLADE) IMPLANT
BLADE ROTATE RAD 12 4 M4 (BLADE) IMPLANT
BLADE ROTATE RAD 40 4 M4 (BLADE) ×1 IMPLANT
BLADE ROTATE RAD12 5PK M4 4MM (BLADE) IMPLANT
BLADE ROTATE TRICUT 4X13 M4 (BLADE) ×2 IMPLANT
BLADE TRICUT ROTATE M4 4 5PK (BLADE) IMPLANT
BUR HS RAD FRONTAL 3 (BURR) IMPLANT
CANISTER SUC SOCK COL 7 IN (MISCELLANEOUS) ×2 IMPLANT
CANISTER SUCTION 1200CC (MISCELLANEOUS) ×4 IMPLANT
CATH SINUS BALLN 7X16 (CATHETERS) IMPLANT
CATH SINUS BALLN RELIEV 6X16 (SINUPLASTY) IMPLANT
CATH SINUS GUIDE F-70 (CATHETERS) IMPLANT
CATH SINUS GUIDE M/110 (CATHETERS) IMPLANT
CATH SINUS IRRIGATION 2.0 (CATHETERS) IMPLANT
CLOTH BEACON ORANGE TIMEOUT ST (SAFETY) ×2 IMPLANT
COAGULATOR SUCT SWTCH 10FR 6 (ELECTROSURGICAL) IMPLANT
DECANTER SPIKE VIAL GLASS SM (MISCELLANEOUS) IMPLANT
DEVICE INFLATION 20/61 (MISCELLANEOUS) IMPLANT
DRAPE SURG 17X23 STRL (DRAPES) ×1 IMPLANT
DRESSING NASAL KENNEDY 3.5X.9 (MISCELLANEOUS) IMPLANT
DRSG NASAL KENNEDY 3.5X.9 (MISCELLANEOUS)
DRSG NASOPORE 8CM (GAUZE/BANDAGES/DRESSINGS) IMPLANT
DRSG TELFA 3X8 NADH (GAUZE/BANDAGES/DRESSINGS) IMPLANT
ELECT COATED BLADE 2.86 ST (ELECTRODE) IMPLANT
ELECT REM PT RETURN 9FT ADLT (ELECTROSURGICAL)
ELECTRODE REM PT RTRN 9FT ADLT (ELECTROSURGICAL) IMPLANT
GLOVE BIOGEL M 7.0 STRL (GLOVE) ×4 IMPLANT
GLOVE SKINSENSE NS SZ7.0 (GLOVE) ×1
GLOVE SKINSENSE STRL SZ7.0 (GLOVE) IMPLANT
GOWN PREVENTION PLUS XLARGE (GOWN DISPOSABLE) ×3 IMPLANT
HANDLE SINUS GUIDE LP (INSTRUMENTS) IMPLANT
HANDPIECE HYDRODEBRIDER FRONT (BLADE) IMPLANT
IV NS 1000ML (IV SOLUTION)
IV NS 1000ML BAXH (IV SOLUTION) IMPLANT
IV NS 500ML (IV SOLUTION) ×2
IV NS 500ML BAXH (IV SOLUTION) ×1 IMPLANT
NEEDLE 27GAX1X1/2 (NEEDLE) ×2 IMPLANT
NS IRRIG 1000ML POUR BTL (IV SOLUTION) ×1 IMPLANT
PACK BASIN DAY SURGERY FS (CUSTOM PROCEDURE TRAY) ×2 IMPLANT
PACK ENT DAY SURGERY (CUSTOM PROCEDURE TRAY) ×2 IMPLANT
PAD DRESSING TELFA 3X8 NADH (GAUZE/BANDAGES/DRESSINGS) IMPLANT
PAD ENT ADHESIVE 25PK (MISCELLANEOUS) ×2 IMPLANT
PENCIL BUTTON HOLSTER BLD 10FT (ELECTRODE) IMPLANT
SET EXT MALE ROTATING LL 32IN (MISCELLANEOUS) IMPLANT
SET IV EXT TUBING FEMALE 31 (MISCELLANEOUS) IMPLANT
SLEEVE SCD COMPRESS KNEE MED (MISCELLANEOUS) ×1 IMPLANT
SOLUTION BUTLER CLEAR DIP (MISCELLANEOUS) ×2 IMPLANT
SPLINT NASAL DOYLE BI-VL (GAUZE/BANDAGES/DRESSINGS) IMPLANT
SPONGE GAUZE 2X2 8PLY STRL LF (GAUZE/BANDAGES/DRESSINGS) ×2 IMPLANT
SPONGE NEURO XRAY DETECT 1X3 (DISPOSABLE) ×2 IMPLANT
SUT ETHILON 3 0 PS 1 (SUTURE) IMPLANT
SUT PLAIN 4 0 ~~LOC~~ 1 (SUTURE) IMPLANT
SUT SILK 3 0 PS 1 (SUTURE) IMPLANT
SYR 3ML 23GX1 SAFETY (SYRINGE) IMPLANT
SYSTEM HYDRODEBRIDER (MISCELLANEOUS) IMPLANT
SYSTEM RELIEVA LUMA ILLUM (SINUPLASTY) IMPLANT
TOWEL OR 17X24 6PK STRL BLUE (TOWEL DISPOSABLE) ×4 IMPLANT
TRACKER ENT INSTRUMENT (MISCELLANEOUS) ×2 IMPLANT
TRACKER ENT PATIENT (MISCELLANEOUS) ×2 IMPLANT
TUBE ANAEROBIC SPECIMEN COL (MISCELLANEOUS) ×1 IMPLANT
TUBE CONNECTING 20X1/4 (TUBING) ×2 IMPLANT
TUBE SALEM SUMP 12R W/ARV (TUBING) IMPLANT
TUBE SALEM SUMP 16 FR W/ARV (TUBING) ×1 IMPLANT
TUBING STRAIGHTSHOT EPS 5PK (TUBING) ×2 IMPLANT
WATER STERILE IRR 1000ML POUR (IV SOLUTION) ×2 IMPLANT
YANKAUER SUCT BULB TIP NO VENT (SUCTIONS) ×2 IMPLANT

## 2012-08-24 NOTE — Transfer of Care (Signed)
Immediate Anesthesia Transfer of Care Note  Patient: Stacie Kaufman  Procedure(s) Performed: Procedure(s) (LRB) with comments: ENDOSCOPIC SINUS SURGERY WITH FUSION NAVIGATION (N/A) - sinus   Patient Location: PACU  Anesthesia Type:General  Level of Consciousness: awake, alert  and oriented  Airway & Oxygen Therapy: Patient Spontanous Breathing and Patient connected to face mask oxygen  Post-op Assessment: Report given to PACU RN and Post -op Vital signs reviewed and stable  Post vital signs: Reviewed and stable  Complications: No apparent anesthesia complications

## 2012-08-24 NOTE — Anesthesia Postprocedure Evaluation (Signed)
  Anesthesia Post-op Note  Patient: Stacie Kaufman  Procedure(s) Performed: Procedure(s) (LRB) with comments: ENDOSCOPIC SINUS SURGERY WITH FUSION NAVIGATION (N/A) - sinus   Patient Location: PACU  Anesthesia Type:General  Level of Consciousness: awake  Airway and Oxygen Therapy: Patient Spontanous Breathing  Post-op Pain: mild  Post-op Assessment: Post-op Vital signs reviewed  Post-op Vital Signs: Reviewed  Complications: No apparent anesthesia complications

## 2012-08-24 NOTE — H&P (Signed)
Stacie Kaufman is an 41 y.o. female.   Chief Complaint: chronic sinusitis HPI: chronic sx of cough, sinusitis and headache  Past Medical History  Diagnosis Date  . Headache     sinus  . Crohn's disease   . Anxiety     chronic  . Sinusitis, chronic 08/2012  . Asthma     exercise-induced; prn inhaler  . Complication of anesthesia     states has small mouth    Past Surgical History  Procedure Date  . Cesarean section 06/09/2005  . Septoplasty 2011  . Varicose vein surgery     Family History  Problem Relation Age of Onset  . Anesthesia problems Mother     post-op nausea   Social History:  reports that she has never smoked. She has never used smokeless tobacco. She reports that she drinks alcohol. She reports that she does not use illicit drugs.  Allergies:  Allergies  Allergen Reactions  . Remicade (Infliximab) Shortness Of Breath  . Omnicef (Cefdinir) Diarrhea and Nausea And Vomiting  . Sulfonamide Derivatives Other (See Comments)    CROHN'S DISEASE FLARE  . Avelox (Moxifloxacin Hcl In Nacl) Other (See Comments)    JOINT PAIN  . Levaquin (Levofloxacin In D5w) Other (See Comments)    JOINT PAIN  . Tetracycline Rash    Medications Prior to Admission  Medication Sig Dispense Refill  . acetaminophen (TYLENOL) 500 MG tablet Take 500 mg by mouth every 6 (six) hours as needed. Pain      . B Complex-Biotin-FA (B-COMPLEX PO) Take 1 tablet by mouth daily.      Marland Kitchen buPROPion (WELLBUTRIN XL) 150 MG 24 hr tablet Take 150 mg by mouth daily.      . cholecalciferol (VITAMIN D) 1000 UNITS tablet Take 2,000 Units by mouth daily.      . ciclesonide (OMNARIS) 50 MCG/ACT nasal spray Place 2 sprays into both nostrils daily.      . clarithromycin (BIAXIN) 250 MG tablet Take 500 mg by mouth daily.      . clonazePAM (KLONOPIN) 0.5 MG tablet Take 0.25 mg by mouth 2 (two) times daily as needed. Takes 5 nights/week      . cycloSPORINE (RESTASIS) 0.05 % ophthalmic emulsion Place 2 drops into both  eyes 2 (two) times daily.       Marland Kitchen escitalopram (LEXAPRO) 10 MG tablet Take 10 mg by mouth every evening.      . fluticasone (FLOVENT HFA) 110 MCG/ACT inhaler Inhale 2 puffs into the lungs every evening.      Marland Kitchen levocetirizine (XYZAL) 5 MG tablet Take 5 mg by mouth daily.      . naproxen (NAPROSYN) 500 MG tablet Take 500 mg by mouth 2 (two) times daily as needed. pain      . norethindrone-ethinyl estradiol (TRIPHASIL,CYCLAFEM,ALYACEN) 0.5/0.75/1-35 MG-MCG tablet Take 1 tablet by mouth daily.      Marland Kitchen omeprazole (PRILOSEC) 20 MG capsule Take 20 mg by mouth daily.      . ondansetron (ZOFRAN) 8 MG tablet Take 1 tablet (8 mg total) by mouth every 4 (four) hours as needed for nausea.  4 tablet  0  . predniSONE (DELTASONE) 10 MG tablet Take 10 mg by mouth daily.      . Probiotic Product (ALIGN) 4 MG CAPS Take 1 capsule by mouth daily.      . pseudoephedrine (SUDAFED) 60 MG tablet Take 60 mg by mouth every 4 (four) hours as needed.      . vitamin  B-12 (CYANOCOBALAMIN) 1000 MCG tablet Take 2,000 mcg by mouth daily. Take two tablets once daily      . zolpidem (AMBIEN CR) 6.25 MG CR tablet Take 6.25 mg by mouth at bedtime as needed. sleep      . albuterol (PROVENTIL HFA;VENTOLIN HFA) 108 (90 BASE) MCG/ACT inhaler Inhale 2 puffs into the lungs as needed. Before exercise      . ALPRAZolam (XANAX) 0.25 MG tablet Take 0.25 mg by mouth at bedtime as needed. Anxiety        No results found for this or any previous visit (from the past 48 hour(s)). No results found.  Review of Systems  Constitutional: Positive for malaise/fatigue.  Respiratory: Negative.   Cardiovascular: Negative.   Musculoskeletal: Negative.   Neurological: Negative.     Blood pressure 111/71, pulse 112, temperature 98.6 F (37 C), temperature source Oral, resp. rate 16, height 5\' 6"  (1.676 m), weight 51.891 kg (114 lb 6.4 oz), last menstrual period 08/02/2012, SpO2 100.00%. Physical Exam  Constitutional: She is oriented to person,  place, and time. She appears well-developed and well-nourished.  HENT:  Nose: Nose normal.  Neck: Normal range of motion. Neck supple.  Cardiovascular: Normal rate.   Respiratory: Effort normal.  GI: Soft.  Musculoskeletal: Normal range of motion.  Neurological: She is alert and oriented to person, place, and time.     Assessment/Plan Adm for OP sinus surgery  Stacie Kaufman 08/24/2012, 10:26 AM

## 2012-08-24 NOTE — Brief Op Note (Signed)
08/24/2012  12:22 PM  PATIENT:  Stacie Kaufman  41 y.o. female  PRE-OPERATIVE DIAGNOSIS:  CHRONIC SINUSITIS  POST-OPERATIVE DIAGNOSIS:  chronic sinusitis bilaterally  PROCEDURE:  Procedure(s) (LRB) with comments: ENDOSCOPIC SINUS SURGERY WITH FUSION NAVIGATION (N/A) - sinus   SURGEON:  Surgeon(s) and Role:    * Osborn Coho, MD - Primary  PHYSICIAN ASSISTANT:   ASSISTANTS: none   ANESTHESIA:   general  EBL:  Total I/O In: 1000 [I.V.:1000] Out: - 100 cc  BLOOD ADMINISTERED:none  DRAINS: none   LOCAL MEDICATIONS USED:  LIDOCAINE  and Amount: 4 ml  SPECIMEN:  Source of Specimen:  sinus contents  DISPOSITION OF SPECIMEN:  PATHOLOGY  COUNTS:  YES  TOURNIQUET:  * No tourniquets in log *  DICTATION: .Other Dictation: Dictation Number [id number]  PLAN OF CARE: Discharge to home after PACU  PATIENT DISPOSITION:  PACU - hemodynamically stable.   Delay start of Pharmacological VTE agent (>24hrs) due to surgical blood loss or risk of bleeding: not applicable

## 2012-08-24 NOTE — Anesthesia Procedure Notes (Signed)
Procedure Name: Intubation Date/Time: 08/24/2012 10:55 AM Performed by: Zenia Resides D Pre-anesthesia Checklist: Patient identified, Emergency Drugs available, Suction available and Patient being monitored Patient Re-evaluated:Patient Re-evaluated prior to inductionOxygen Delivery Method: Circle System Utilized Preoxygenation: Pre-oxygenation with 100% oxygen Intubation Type: IV induction Ventilation: Mask ventilation without difficulty Laryngoscope Size: Mac and 3 Grade View: Grade I Tube type: Oral Tube size: 7.0 mm Number of attempts: 1 Airway Equipment and Method: stylet and oral airway Placement Confirmation: ETT inserted through vocal cords under direct vision,  positive ETCO2 and breath sounds checked- equal and bilateral Secured at: 22 cm Tube secured with: Tape Dental Injury: Teeth and Oropharynx as per pre-operative assessment

## 2012-08-24 NOTE — Anesthesia Preprocedure Evaluation (Addendum)
Anesthesia Evaluation  Patient identified by MRN, date of birth, ID band Patient awake    Reviewed: Allergy & Precautions, H&P , NPO status , Patient's Chart, lab work & pertinent test results  Airway Mallampati: II      Dental   Pulmonary asthma ,  breath sounds clear to auscultation        Cardiovascular negative cardio ROS  Rhythm:Regular Rate:Normal     Neuro/Psych  Headaches, Anxiety    GI/Hepatic negative GI ROS, Neg liver ROS,   Endo/Other  negative endocrine ROS  Renal/GU negative Renal ROS     Musculoskeletal   Abdominal   Peds  Hematology negative hematology ROS (+)   Anesthesia Other Findings   Reproductive/Obstetrics                          Anesthesia Physical Anesthesia Plan  ASA: III  Anesthesia Plan: General   Post-op Pain Management:    Induction: Intravenous  Airway Management Planned: Oral ETT  Additional Equipment:   Intra-op Plan:   Post-operative Plan: Extubation in OR  Informed Consent: I have reviewed the patients History and Physical, chart, labs and discussed the procedure including the risks, benefits and alternatives for the proposed anesthesia with the patient or authorized representative who has indicated his/her understanding and acceptance.     Plan Discussed with: CRNA, Anesthesiologist and Surgeon  Anesthesia Plan Comments:         Anesthesia Quick Evaluation

## 2012-08-25 ENCOUNTER — Encounter (HOSPITAL_BASED_OUTPATIENT_CLINIC_OR_DEPARTMENT_OTHER): Payer: Self-pay | Admitting: Otolaryngology

## 2012-08-25 NOTE — Op Note (Signed)
[name], Stacie Kaufman                 ACCOUNT NO.:  [id number]  MEDICAL RECORD NO.:  [id number]  LOCATION:                                 FACILITY:  PHYSICIAN:  Kinnie Scales. Annalee Genta, M.D.DATE OF BIRTH:  19-Jul-1971  DATE OF PROCEDURE:  08/24/2012 DATE OF DISCHARGE:                              OPERATIVE REPORT   LOCATION:  [facility].  PREOPERATIVE DIAGNOSIS:  Chronic sinusitis.  POSTOPERATIVE DIAGNOSIS:  Chronic sinusitis.  INDICATIONS FOR SURGERY:  Chronic sinusitis.  SURGICAL PROCEDURE:  Bilateral endoscopic sinus surgery with intraoperative computer-assisted navigation (Fusion) consisting of bilateral total ethmoidectomy, bilateral maxillary antrostomy, bilateral nasofrontal recess exploration and right sphenoidotomy.  ANESTHESIA:  General endotracheal.  COMPLICATIONS:  None.  ESTIMATED BLOOD LOSS:  Less than 100 mL.  The patient transferred from the operating room to the recovery room in stable condition.  FINDINGS:  The patient had undergone previous septorhinoplasty, nasal airway was patent with septum in midline position.  Moderate amount of scarring between the middle meatus and the lateral nasal wall noted primarily on the right.  The patient had mucopurulent discharge in the ethmoid and maxillary sinuses bilaterally.  Representative culture and sensitivity sent to the Microbiology Laboratory for evaluation. Moderate polypoid disease involving the ethmoid, maxillary, and nasofrontal sinuses bilaterally.  No packing was placed.  BRIEF HISTORY:  The patient is a 41 year old, white female, referred to our office for evaluation of chronic sinusitis.  The patient has a past medical history of Crohn disease and chronic mild exercise-induced asthma.  She has multiple medical allergies and reports a several-year history of progressive symptoms of nasal congestion, sinus pressure and headache.  She has undergone previous septorhinoplasty to  correct the nasal deformity with good result regarding breathing, but continued ongoing active symptoms of sinusitis.  Over the last 6 months, she has had chronic sinus symptoms with purulent nasal discharge, cough, fatigue, requiring multiple courses of antibiotics and steroids with limited improvement.  The patient was treated with topical nasal steroids and saline irrigation, and despite appropriate aggressive medical therapy, continued to have ongoing symptoms.  Evaluation in our office including CT scan showed polypoid disease involving the ethmoid and maxillary sinuses and nasofrontal recess.  The patient continued ongoing aggressive therapy and repeat scan was performed, which showed some improvement and sinus findings, but continued clinical history of postnasal discharge, cough, headache and fatigue.  Given her history and findings, I recommended the above surgical procedures.  The risks, benefits, and possible complications of these procedures were discussed in detail with the patient, who understood and concurred with our plan for surgery, which was scheduled on an outpatient basis at [facility].  PROCEDURE:  The patient was brought to the operating room and placed in supine position on August 24, 2012.  General endotracheal anesthesia was established without difficulty.  When the patient was adequately anesthetized, she was position and prepped and draped in a sterile fashion.  The patient's nose was then injected with a total of 4 mL of 1% lidocaine with 1:100 dilution epinephrine and injected in a submucosal fashion along the lateral nasal wall and middle turbinate bilaterally.  The patient's nose was then packed with  Afrin-soaked cotton pledgets and were left in place for approximately 10 minutes allowed for vasoconstriction and hemostasis.  The [employer] Fusion navigation headgear was applied and anatomic and surgical landmarks  were identified and confirmed.  The navigation tool was used throughout the surgical procedure.  With the patient prepared for surgery, she was then positioned, and then prepped and draped.  The surgical procedure was begun on the patient's left-hand side using 0- degree telescope.  The nasal cavity was examined.  There was mucopurulent discharge emanating from the middle meatus and streaming into the posterior nasopharynx.  The middle turbinate was carefully medialized, and inspection of the middle meatus was then undertaken. The uncinate process was reflected anteriorly and resected in its entirety.  Again, mucopurulent discharge noted coming from the left maxillary sinus ostium and anterior ethmoid region.  Using a 0-degree telescope and a straight microdebrider, ethmoidectomy was performed, dissecting along the floor of the ethmoid sinus from anterior to posterior.  Bony septations were resected.  There was small amount of mucosal disease and mucopurulent discharge within the sinuses, which was completely cleared.  The posterior superior aspect of the ethmoid sinus was identified, and using a 45-degree telescope and a curved microdebrider with the navigation tool, dissection was carried from posterior to anterior, again resecting bony septations and thickened mucosa.  The nasofrontal recess was identified.  This was occluded with underlying anterior ethmoid air cells and thickened mucosa.  The nasofrontal recess was widely opened, creating direct access into the frontal sinus.  A small amount of bone and thickened mucosa was resected to create a widely patent nasal frontal recess.  Attention was then turned to lateral nasal wall where residual uncinate process was resected within the left maxillary sinus.  There was mucopurulent material, which was irrigated and then suctioned.  Culture and sensitivity was sent as a representative sample.  Attention was then turned to the patient's  right-hand side.  Again, it was began with a 0-degree endoscope.  The middle turbinate was carefully medialized.  The uncinate process was reflected anteriorly and resected with a through-cutting forceps.  Dissection was carried from anterior to posterior beginning with resection of the ethmoid bullae using a straight microdebrider and dissect into the posterior ethmoid region. There was mucosal thickening and some mucopurulent discharge in this area as well and a sphenoidotomy was performed through the posterior wall of the ethmoid sinus communicating with the natural ostium of the sphenoid sinus and creating widely patent opening.  No significant purulent material active disease was noted within the sphenoid sinus. Then, dissecting from posterior to anterior along the roof of the ethmoid sinus using a 45-degree telescope and a curved microdebrider, the posterior to anterior ethmoid dissection was carried out again removing bony septations and thickened mucosa.  Minimal purulent discharge was noted in this area.  Dissection was then carried superiorly along the skull base into the nasofrontal recess resecting ethmoid air cells and creating a widely patent frontal sinus ostium. Several accessory suprabullar ethmoid cells were identified and their ostia were also opened to create a widely patent confluence of sinus drainage from the frontal and superior ethmoid region.  Attention was then turned to the lateral nasal wall where the residual uncinate process was reflected anteriorly and resected with a curved microdebrider.  The maxillary sinus appeared clear of any polypoid disease or active infection.  The patient's nasal cavity was inspected bilaterally.  There was no bleeding and minimal surgical  debris, which was cleared.  Sinuses were irrigated.  The nasopharynx was suctioned, orogastric tube passed, stomach contents were aspirated.  The patient was then awakened from anesthetic.   She was extubated and transferred from the operating room to the recovery room in stable condition.  There were no complications.  Estimated blood loss was approximately 100 mL.          ______________________________ Kinnie Scales. Annalee Genta, M.D.     DLS/MEDQ  D:  11/91/4782  T:  08/24/2012  Job:  [id number]

## 2012-08-30 LAB — ANAEROBIC CULTURE

## 2013-05-19 ENCOUNTER — Other Ambulatory Visit (HOSPITAL_COMMUNITY): Payer: Self-pay | Admitting: Otolaryngology

## 2013-05-19 DIAGNOSIS — J329 Chronic sinusitis, unspecified: Secondary | ICD-10-CM

## 2013-05-20 ENCOUNTER — Ambulatory Visit (HOSPITAL_COMMUNITY)
Admission: RE | Admit: 2013-05-20 | Discharge: 2013-05-20 | Disposition: A | Discharge status: Home / Self Care | Payer: 59 | Source: Ambulatory Visit | Attending: Otolaryngology | Admitting: Otolaryngology

## 2013-05-20 DIAGNOSIS — J329 Chronic sinusitis, unspecified: Secondary | ICD-10-CM

## 2013-05-20 DIAGNOSIS — J3489 Other specified disorders of nose and nasal sinuses: Secondary | ICD-10-CM | POA: Insufficient documentation

## 2013-06-07 ENCOUNTER — Ambulatory Visit: Payer: 59 | Admitting: Vascular Surgery

## 2013-06-14 ENCOUNTER — Ambulatory Visit: Payer: 59 | Admitting: Vascular Surgery

## 2013-06-17 ENCOUNTER — Other Ambulatory Visit: Payer: Self-pay | Admitting: Vascular Surgery

## 2013-06-17 DIAGNOSIS — I83893 Varicose veins of bilateral lower extremities with other complications: Secondary | ICD-10-CM

## 2013-06-27 ENCOUNTER — Encounter: Payer: Self-pay | Admitting: Vascular Surgery

## 2013-06-28 ENCOUNTER — Encounter (INDEPENDENT_AMBULATORY_CARE_PROVIDER_SITE_OTHER): Payer: Self-pay

## 2013-06-28 ENCOUNTER — Encounter: Payer: Self-pay | Admitting: Vascular Surgery

## 2013-06-28 ENCOUNTER — Ambulatory Visit (INDEPENDENT_AMBULATORY_CARE_PROVIDER_SITE_OTHER): Payer: 59 | Admitting: Vascular Surgery

## 2013-06-28 ENCOUNTER — Ambulatory Visit (HOSPITAL_COMMUNITY)
Admission: RE | Admit: 2013-06-28 | Discharge: 2013-06-28 | Disposition: A | Discharge status: Home / Self Care | Payer: 59 | Source: Ambulatory Visit | Attending: Vascular Surgery | Admitting: Vascular Surgery

## 2013-06-28 VITALS — BP 108/73 | HR 79 | Resp 16 | Ht 66.0 in | Wt 119.0 lb

## 2013-06-28 DIAGNOSIS — I83893 Varicose veins of bilateral lower extremities with other complications: Secondary | ICD-10-CM

## 2013-06-28 DIAGNOSIS — I998 Other disorder of circulatory system: Secondary | ICD-10-CM | POA: Insufficient documentation

## 2013-06-28 NOTE — Progress Notes (Signed)
Subjective:     Patient ID: Stacie Kaufman, female   DOB: Nov 12, 1970, 42 y.o.   MRN: [id number]  HPI this 42 year old female had previous laser ablation of bilateral great saphenous veins performed by me in 2010 she had an excellent result. She has not had any pain or bulging varicosities since that time. She did notice some new spider type veins in both legs and is concerned about this. She has had no history of DVT, thrombophlebitis, stasis ulcers, or bleeding. She is not wear elastic compression stockings. She has no distal edema.  Past Medical History  Diagnosis Date  . Headache(784.0)     sinus  . Crohn's disease   . Anxiety     chronic  . Sinusitis, chronic 08/2012  . Asthma     exercise-induced; prn inhaler  . Complication of anesthesia     states has small mouth    History  Substance Use Topics  . Smoking status: Never Smoker   . Smokeless tobacco: Never Used  . Alcohol Use: Yes     Comment: occasionally    Family History  Problem Relation Age of Onset  . Anesthesia problems Mother     post-op nausea    Allergies  Allergen Reactions  . Remicade [Infliximab] Shortness Of Breath  . Omnicef [Cefdinir] Diarrhea and Nausea And Vomiting  . Sulfonamide Derivatives Other (See Comments)    CROHN'S DISEASE FLARE  . Avelox [Moxifloxacin Hcl In Nacl] Other (See Comments)    JOINT PAIN  . Levaquin [Levofloxacin In D5w] Other (See Comments)    JOINT PAIN  . Tetracycline Rash    Current outpatient prescriptions:acetaminophen (TYLENOL) 500 MG tablet, Take 500 mg by mouth every 6 (six) hours as needed. Pain, Disp: , Rfl: ;  albuterol (PROVENTIL HFA;VENTOLIN HFA) 108 (90 BASE) MCG/ACT inhaler, Inhale 2 puffs into the lungs as needed. Before exercise, Disp: , Rfl: ;  ALPRAZolam (XANAX) 0.25 MG tablet, Take 0.25 mg by mouth at bedtime as needed. Anxiety, Disp: , Rfl:  B Complex-Biotin-FA (B-COMPLEX PO), Take 1 tablet by mouth daily., Disp: , Rfl: ;  buPROPion (WELLBUTRIN XL) 150 MG  24 hr tablet, Take 150 mg by mouth daily., Disp: , Rfl: ;  cholecalciferol (VITAMIN D) 1000 UNITS tablet, Take 2,000 Units by mouth daily., Disp: , Rfl: ;  clonazePAM (KLONOPIN) 0.5 MG tablet, Take 0.5 mg by mouth 2 (two) times daily as needed. Takes 5 nights/week, Disp: , Rfl:  cycloSPORINE (RESTASIS) 0.05 % ophthalmic emulsion, Place 2 drops into both eyes 2 (two) times daily. , Disp: , Rfl: ;  escitalopram (LEXAPRO) 10 MG tablet, Take 10 mg by mouth every evening., Disp: , Rfl: ;  fluticasone (FLOVENT HFA) 110 MCG/ACT inhaler, Inhale 2 puffs into the lungs every evening., Disp: , Rfl: ;  levocetirizine (XYZAL) 5 MG tablet, Take 5 mg by mouth every evening., Disp: , Rfl:  methocarbamol (ROBAXIN) 500 MG tablet, Take 500 mg by mouth 4 (four) times daily., Disp: , Rfl: ;  naproxen (NAPROSYN) 500 MG tablet, Take 500 mg by mouth 2 (two) times daily as needed. pain, Disp: , Rfl: ;  norethindrone-ethinyl estradiol (TRIPHASIL,CYCLAFEM,ALYACEN) 0.5/0.75/1-35 MG-MCG tablet, Take 1 tablet by mouth daily., Disp: , Rfl: ;  Probiotic Product (ALIGN) 4 MG CAPS, Take 1 capsule by mouth daily., Disp: , Rfl:  vitamin B-12 (CYANOCOBALAMIN) 1000 MCG tablet, Take 2,000 mcg by mouth daily. Take two tablets once daily, Disp: , Rfl: ;  zolpidem (AMBIEN CR) 6.25 MG CR tablet, Take 6.25  mg by mouth at bedtime as needed. sleep, Disp: , Rfl: ;  clarithromycin (BIAXIN) 250 MG tablet, Take 500 mg by mouth daily., Disp: , Rfl:  HYDROcodone-acetaminophen (NORCO) 5-325 MG per tablet, Take 1-2 tablets by mouth every 6 (six) hours as needed for pain., Disp: 30 tablet, Rfl: 0;  omeprazole (PRILOSEC) 20 MG capsule, Take 20 mg by mouth daily., Disp: , Rfl: ;  ondansetron (ZOFRAN) 8 MG tablet, Take 1 tablet (8 mg total) by mouth every 4 (four) hours as needed for nausea., Disp: 4 tablet, Rfl: 0;  predniSONE (DELTASONE) 10 MG tablet, Take 10 mg by mouth daily., Disp: , Rfl:   BP 108/73  Pulse 79  Resp 16  Ht 5\' 6"  (1.676 m)  Wt 119 lb (53.978  kg)  BMI 19.22 kg/m2  Body mass index is 19.22 kg/(m^2).           Review of Systems denies chest pain, dyspnea on exertion, PND, orthopnea, hemoptysis    Objective:   Physical Exam BP 108/73  Pulse 79  Resp 16  Ht 5\' 6"  (1.676 m)  Wt 119 lb (53.978 kg)  BMI 19.22 kg/m2  General well-developed well-nourished female in no apparent stress alert and oriented x3 Lungs no rhonchi or wheezing Lower extremities with no bulging varicosities or distal edema noted. 3+ pulses distally palpable. There are diffuse scattered spider veins in the proximal medial thigh on the left and the medial calf on the right and left. There is no hyperpigmentation or ulceration noted.  Today I ordered bilateral venous duplex exam which I reviewed and interpreted. Great saphenous veins are totally ablated and closed bilaterally. There is no DVT but there is some mild deep reflux on the right      Assessment:     Good cosmetic result 4 years post bilateral ablation great saphenous vein now with some recurrent spider veins    Plan:     Have offered the patient sclerotherapy for this if she should desire this

## 2014-01-25 ENCOUNTER — Other Ambulatory Visit: Payer: Self-pay | Admitting: Obstetrics and Gynecology

## 2014-01-25 DIAGNOSIS — Z1231 Encounter for screening mammogram for malignant neoplasm of breast: Secondary | ICD-10-CM

## 2014-02-09 ENCOUNTER — Ambulatory Visit: Payer: 59

## 2015-06-04 ENCOUNTER — Encounter: Payer: Self-pay | Admitting: *Deleted

## 2015-06-06 ENCOUNTER — Ambulatory Visit: Payer: 59 | Admitting: *Deleted

## 2015-06-06 ENCOUNTER — Ambulatory Visit (INDEPENDENT_AMBULATORY_CARE_PROVIDER_SITE_OTHER): Payer: 59 | Admitting: *Deleted

## 2015-06-06 DIAGNOSIS — I8393 Asymptomatic varicose veins of bilateral lower extremities: Secondary | ICD-10-CM

## 2015-06-06 DIAGNOSIS — I781 Nevus, non-neoplastic: Secondary | ICD-10-CM

## 2015-06-06 NOTE — Progress Notes (Signed)
X=.3% Sotradecol administered with a 27g butterfly.  Patient received a total of 12cc.  Treated the majority of her spider veins and small reticular veins. Easy access. Tol well. Anticipate good results. Follow prn.  Photos: No.  Compression stockings applied: Yes.

## 2015-09-24 MED FILL — PREVIDENT 5000 BOOSTER PLUS: 1.1 | 30 days supply | Qty: 100 | Fill #5

## 2015-10-02 MED FILL — ESCITALOPRAM 20 MG TABLET: 20 | 90 days supply | Qty: 90 | Fill #0

## 2015-10-12 MED FILL — VENTOLIN HFA 90 MCG INHALER: 108 (90 BAS | 16 days supply | Qty: 18 | Fill #0

## 2015-10-12 MED FILL — LEVOCETIRIZINE 5 MG TABLET: 5 | 90 days supply | Qty: 90 | Fill #0

## 2015-10-22 MED FILL — ENPRESSE-28 TABLET: 84 days supply | Qty: 84 | Fill #3

## 2015-10-23 MED FILL — FLOVENT HFA 110 MCG INHALER: 110 | 30 days supply | Qty: 12 | Fill #2

## 2015-10-23 MED FILL — OMNARIS 50 MCG NASAL SPRAY: 50 | 30 days supply | Qty: 13 | Fill #2

## 2015-10-24 DIAGNOSIS — I8311 Varicose veins of right lower extremity with inflammation: Secondary | ICD-10-CM | POA: Diagnosis not present

## 2015-10-24 DIAGNOSIS — M79604 Pain in right leg: Secondary | ICD-10-CM | POA: Diagnosis not present

## 2015-10-24 DIAGNOSIS — M79605 Pain in left leg: Secondary | ICD-10-CM | POA: Diagnosis not present

## 2015-10-24 DIAGNOSIS — I8312 Varicose veins of left lower extremity with inflammation: Secondary | ICD-10-CM | POA: Diagnosis not present

## 2015-11-09 DIAGNOSIS — R05 Cough: Secondary | ICD-10-CM | POA: Diagnosis not present

## 2015-11-12 MED FILL — BUPROPION HCL XL 150 MG TAB: 150 | 90 days supply | Qty: 90 | Fill #1

## 2015-11-14 DIAGNOSIS — M79605 Pain in left leg: Secondary | ICD-10-CM | POA: Diagnosis not present

## 2015-11-14 DIAGNOSIS — M79604 Pain in right leg: Secondary | ICD-10-CM | POA: Diagnosis not present

## 2015-11-14 DIAGNOSIS — I8311 Varicose veins of right lower extremity with inflammation: Secondary | ICD-10-CM | POA: Diagnosis not present

## 2015-11-14 DIAGNOSIS — I8312 Varicose veins of left lower extremity with inflammation: Secondary | ICD-10-CM | POA: Diagnosis not present

## 2015-12-12 MED FILL — PREVIDENT 5000 BOOSTER PLUS: 1.1 | 30 days supply | Qty: 100 | Fill #0

## 2015-12-18 DIAGNOSIS — J3081 Allergic rhinitis due to animal (cat) (dog) hair and dander: Secondary | ICD-10-CM | POA: Diagnosis not present

## 2015-12-18 DIAGNOSIS — J4530 Mild persistent asthma, uncomplicated: Secondary | ICD-10-CM | POA: Diagnosis not present

## 2015-12-18 DIAGNOSIS — J3089 Other allergic rhinitis: Secondary | ICD-10-CM | POA: Diagnosis not present

## 2015-12-18 MED FILL — FLOVENT HFA 220 MCG INHALER: 220 | 30 days supply | Qty: 12 | Fill #0

## 2015-12-19 DIAGNOSIS — I8311 Varicose veins of right lower extremity with inflammation: Secondary | ICD-10-CM | POA: Diagnosis not present

## 2015-12-19 DIAGNOSIS — I8312 Varicose veins of left lower extremity with inflammation: Secondary | ICD-10-CM | POA: Diagnosis not present

## 2015-12-26 MED FILL — OMNARIS 50 MCG NASAL SPRAY: 50 | 30 days supply | Qty: 13 | Fill #0

## 2015-12-27 ENCOUNTER — Telehealth: Payer: 59 | Admitting: Physician Assistant

## 2015-12-27 DIAGNOSIS — B9689 Other specified bacterial agents as the cause of diseases classified elsewhere: Secondary | ICD-10-CM

## 2015-12-27 DIAGNOSIS — J019 Acute sinusitis, unspecified: Secondary | ICD-10-CM | POA: Diagnosis not present

## 2015-12-27 MED ORDER — AMOXICILLIN-POT CLAVULANATE 875-125 MG PO TABS: 1.0000 | ORAL_TABLET | Freq: Two times a day (BID) | ORAL | Status: DC

## 2015-12-27 NOTE — Progress Notes (Signed)

## 2015-12-31 MED FILL — ESCITALOPRAM 20 MG TABLET: 20 | 90 days supply | Qty: 90 | Fill #1

## 2015-12-31 MED FILL — LEVOCETIRIZINE 5 MG TABLET: 5 | 90 days supply | Qty: 90 | Fill #0

## 2016-01-02 ENCOUNTER — Telehealth: Payer: 59 | Admitting: Family

## 2016-01-02 DIAGNOSIS — B3731 Acute candidiasis of vulva and vagina: Secondary | ICD-10-CM

## 2016-01-02 DIAGNOSIS — B373 Candidiasis of vulva and vagina: Secondary | ICD-10-CM

## 2016-01-02 MED ORDER — FLUCONAZOLE 150 MG PO TABS: 150.0000 mg | ORAL_TABLET | Freq: Once | ORAL | Status: AC

## 2016-01-02 NOTE — Progress Notes (Signed)

## 2016-01-16 DIAGNOSIS — F5101 Primary insomnia: Secondary | ICD-10-CM | POA: Diagnosis not present

## 2016-01-16 DIAGNOSIS — R5383 Other fatigue: Secondary | ICD-10-CM | POA: Diagnosis not present

## 2016-01-16 DIAGNOSIS — K5010 Crohn's disease of large intestine without complications: Secondary | ICD-10-CM | POA: Diagnosis not present

## 2016-01-16 DIAGNOSIS — I7300 Raynaud's syndrome without gangrene: Secondary | ICD-10-CM | POA: Diagnosis not present

## 2016-01-16 DIAGNOSIS — F411 Generalized anxiety disorder: Secondary | ICD-10-CM | POA: Diagnosis not present

## 2016-01-16 MED FILL — ZOLPIDEM TART ER 6.25 MG TA: 6.25 | 30 days supply | Qty: 30 | Fill #0

## 2016-01-21 MED FILL — LEVONEST-28 TABLET: 84 days supply | Qty: 84 | Fill #4

## 2016-02-05 DIAGNOSIS — Z01419 Encounter for gynecological examination (general) (routine) without abnormal findings: Secondary | ICD-10-CM | POA: Diagnosis not present

## 2016-02-05 DIAGNOSIS — R61 Generalized hyperhidrosis: Secondary | ICD-10-CM | POA: Diagnosis not present

## 2016-02-05 DIAGNOSIS — Z1231 Encounter for screening mammogram for malignant neoplasm of breast: Secondary | ICD-10-CM | POA: Diagnosis not present

## 2016-02-05 DIAGNOSIS — Z3040 Encounter for surveillance of contraceptives, unspecified: Secondary | ICD-10-CM | POA: Diagnosis not present

## 2016-02-05 DIAGNOSIS — Z124 Encounter for screening for malignant neoplasm of cervix: Secondary | ICD-10-CM | POA: Diagnosis not present

## 2016-02-11 MED FILL — BUPROPION HCL XL 150 MG TAB: 150 | 90 days supply | Qty: 90 | Fill #0

## 2016-02-19 MED FILL — FLOVENT HFA 220 MCG INHALER: 220 | 30 days supply | Qty: 12 | Fill #1

## 2016-02-25 MED FILL — AZELASTINE 0.15% NASAL SPRY: 0.15 | 50 days supply | Qty: 30 | Fill #1

## 2016-02-25 MED FILL — OMNARIS 50 MCG NASAL SPRAY: 50 | 30 days supply | Qty: 13 | Fill #1

## 2016-03-12 MED FILL — PREVIDENT 5000 BOOSTER PLUS: 1.1 | 30 days supply | Qty: 100 | Fill #1

## 2016-03-31 DIAGNOSIS — Z1211 Encounter for screening for malignant neoplasm of colon: Secondary | ICD-10-CM | POA: Diagnosis not present

## 2016-03-31 DIAGNOSIS — K5010 Crohn's disease of large intestine without complications: Secondary | ICD-10-CM | POA: Diagnosis not present

## 2016-03-31 DIAGNOSIS — R140 Abdominal distension (gaseous): Secondary | ICD-10-CM | POA: Diagnosis not present

## 2016-04-01 DIAGNOSIS — K5010 Crohn's disease of large intestine without complications: Secondary | ICD-10-CM | POA: Diagnosis not present

## 2016-04-01 MED FILL — LEVOCETIRIZINE 5 MG TABLET: 5 | 90 days supply | Qty: 90 | Fill #1

## 2016-04-01 MED FILL — ESCITALOPRAM 20 MG TABLET: 20 | 90 days supply | Qty: 90 | Fill #0

## 2016-04-04 MED FILL — ZENPEP DR 40,000 UNITS CAP: 40000-12600 | 30 days supply | Qty: 270 | Fill #0

## 2016-04-11 MED FILL — LEVONEST-28 TABLET: 84 days supply | Qty: 84 | Fill #0

## 2016-04-28 MED FILL — FLOVENT HFA 220 MCG INHALER: 220 | 30 days supply | Qty: 12 | Fill #2

## 2016-04-28 MED FILL — ZOLPIDEM TART ER 6.25 MG TA: 6.25 | 30 days supply | Qty: 30 | Fill #1

## 2016-04-29 MED FILL — OMNARIS 50 MCG NASAL SPRAY: 50 | 30 days supply | Qty: 13 | Fill #2

## 2016-04-30 MED FILL — clonazePAM 0.5 MG TABS: 0.5 | 30 days supply | Qty: 90 | Fill #0

## 2016-05-08 MED FILL — ZENPEP DR 40,000 UNITS CAP: 40000-12600 | 30 days supply | Qty: 270 | Fill #1

## 2016-05-16 MED FILL — BUPROPION HCL XL 150 MG TAB: 150 | 90 days supply | Qty: 90 | Fill #1

## 2016-05-21 DIAGNOSIS — I8311 Varicose veins of right lower extremity with inflammation: Secondary | ICD-10-CM | POA: Diagnosis not present

## 2016-05-29 MED FILL — ZENPEP DR 40,000 UNITS CAP: 40000-12600 | 30 days supply | Qty: 270 | Fill #2

## 2016-06-04 DIAGNOSIS — I8311 Varicose veins of right lower extremity with inflammation: Secondary | ICD-10-CM | POA: Diagnosis not present

## 2016-06-09 ENCOUNTER — Other Ambulatory Visit: Payer: Self-pay | Admitting: Dentistry

## 2016-06-09 DIAGNOSIS — R52 Pain, unspecified: Secondary | ICD-10-CM

## 2016-06-17 ENCOUNTER — Other Ambulatory Visit (HOSPITAL_COMMUNITY): Payer: Self-pay | Admitting: *Deleted

## 2016-06-18 DIAGNOSIS — I8312 Varicose veins of left lower extremity with inflammation: Secondary | ICD-10-CM | POA: Diagnosis not present

## 2016-06-22 ENCOUNTER — Other Ambulatory Visit: Payer: 59

## 2016-06-30 MED FILL — LEVONEST-28 TABLET: 84 days supply | Qty: 84 | Fill #1

## 2016-06-30 MED FILL — PREVIDENT 5000 BOOSTER PLUS: 1.1 | 30 days supply | Qty: 100 | Fill #2

## 2016-06-30 MED FILL — ESCITALOPRAM 20 MG TABLET: 20 | 90 days supply | Qty: 90 | Fill #1

## 2016-07-02 DIAGNOSIS — I8312 Varicose veins of left lower extremity with inflammation: Secondary | ICD-10-CM | POA: Diagnosis not present

## 2016-07-04 MED FILL — FLOVENT HFA 220 MCG INHALER: 220 | 30 days supply | Qty: 12 | Fill #3

## 2016-07-07 MED FILL — OMNARIS 50 MCG NASAL SPRAY: 50 | 30 days supply | Qty: 13 | Fill #0

## 2016-07-09 DIAGNOSIS — Z13220 Encounter for screening for lipoid disorders: Secondary | ICD-10-CM | POA: Diagnosis not present

## 2016-07-09 DIAGNOSIS — M79604 Pain in right leg: Secondary | ICD-10-CM | POA: Diagnosis not present

## 2016-07-09 DIAGNOSIS — F411 Generalized anxiety disorder: Secondary | ICD-10-CM | POA: Diagnosis not present

## 2016-07-09 DIAGNOSIS — Z131 Encounter for screening for diabetes mellitus: Secondary | ICD-10-CM | POA: Diagnosis not present

## 2016-07-09 DIAGNOSIS — M79605 Pain in left leg: Secondary | ICD-10-CM | POA: Diagnosis not present

## 2016-07-11 DIAGNOSIS — Z13220 Encounter for screening for lipoid disorders: Secondary | ICD-10-CM | POA: Diagnosis not present

## 2016-07-11 DIAGNOSIS — Z131 Encounter for screening for diabetes mellitus: Secondary | ICD-10-CM | POA: Diagnosis not present

## 2016-07-16 DIAGNOSIS — I8311 Varicose veins of right lower extremity with inflammation: Secondary | ICD-10-CM | POA: Diagnosis not present

## 2016-07-25 MED FILL — NORETHIN-ESTRAD-FERR 1-0.02: 1-20 | 84 days supply | Qty: 84 | Fill #0

## 2016-08-05 DIAGNOSIS — H52223 Regular astigmatism, bilateral: Secondary | ICD-10-CM | POA: Diagnosis not present

## 2016-08-05 DIAGNOSIS — D3132 Benign neoplasm of left choroid: Secondary | ICD-10-CM | POA: Diagnosis not present

## 2016-08-05 DIAGNOSIS — H5213 Myopia, bilateral: Secondary | ICD-10-CM | POA: Diagnosis not present

## 2016-08-05 MED FILL — XIIDRA 5% EYE DROPS: 5 | 30 days supply | Qty: 60 | Fill #0

## 2016-08-06 MED FILL — ZENPEP DR 40,000 UNITS CAP: 40000-12600 | 30 days supply | Qty: 270 | Fill #3

## 2016-08-11 MED FILL — ZOLPIDEM TART ER 6.25 MG TA: 6.25 | 30 days supply | Qty: 30 | Fill #0

## 2016-08-14 MED FILL — AMOXICILLIN 500 MG CAPSULE: 500 | 7 days supply | Qty: 28 | Fill #0

## 2016-08-14 MED FILL — FLOVENT HFA 220 MCG INHALER: 220 | 30 days supply | Qty: 12 | Fill #4

## 2016-08-15 MED FILL — BUPROPION HCL XL 150 MG TAB: 150 | 90 days supply | Qty: 90 | Fill #0

## 2016-08-19 MED FILL — PREVIDENT 5000 BOOSTER PLUS: 1.1 | 30 days supply | Qty: 100 | Fill #3

## 2016-08-22 MED FILL — LARIN 21 1-20 TABLET: 1-20 | 28 days supply | Qty: 21 | Fill #0

## 2016-08-27 MED FILL — AMOXICILLIN 500 MG CAPSULE: 500 | 7 days supply | Qty: 28 | Fill #1

## 2016-09-04 MED FILL — AZELASTINE 0.15% NASAL SPRY: 0.15 | 90 days supply | Qty: 90 | Fill #0

## 2016-09-04 MED FILL — OMNARIS 50 MCG NASAL SPRAY: 50 | 30 days supply | Qty: 13 | Fill #1

## 2016-09-05 MED FILL — AMOXICILLIN 500 MG CAPSULE: 500 | 8 days supply | Qty: 28 | Fill #0

## 2016-09-10 MED FILL — HYDROCODON-APAP 7.5-325: 7.5-325 | 2 days supply | Qty: 8 | Fill #0

## 2016-09-23 MED FILL — PREVIDENT 5000 BOOSTER PLUS: 1.1 | 30 days supply | Qty: 100 | Fill #4

## 2016-09-26 MED FILL — LARIN 21 1-20 TABLET: 1-20 | 28 days supply | Qty: 21 | Fill #1

## 2016-09-26 MED FILL — FLOVENT HFA 220 MCG INHALER: 220 | 30 days supply | Qty: 12 | Fill #5

## 2016-09-30 ENCOUNTER — Telehealth: Payer: 59 | Admitting: Family

## 2016-09-30 DIAGNOSIS — B9689 Other specified bacterial agents as the cause of diseases classified elsewhere: Secondary | ICD-10-CM | POA: Diagnosis not present

## 2016-09-30 DIAGNOSIS — J019 Acute sinusitis, unspecified: Secondary | ICD-10-CM | POA: Diagnosis not present

## 2016-09-30 MED ORDER — AMOXICILLIN-POT CLAVULANATE 875-125 MG PO TABS: 1.0000 | ORAL_TABLET | Freq: Two times a day (BID) | ORAL | 0 refills | Status: AC

## 2016-09-30 MED ORDER — AMOXICILLIN 500 MG PO TABS: 1000.0000 mg | ORAL_TABLET | Freq: Two times a day (BID) | ORAL | 0 refills | Status: AC

## 2016-09-30 NOTE — Progress Notes (Signed)

## 2016-10-01 MED FILL — AMOXICILLIN 500 MG CAPSULE: 500 | 10 days supply | Qty: 40 | Fill #0

## 2016-10-06 DIAGNOSIS — J209 Acute bronchitis, unspecified: Secondary | ICD-10-CM | POA: Diagnosis not present

## 2016-10-06 MED FILL — predniSONE 20 MG TABS: 20 | 14 days supply | Qty: 15 | Fill #0

## 2016-10-06 MED FILL — PROMETHAZINE-CODEINE SYRUP: 6.25-10 | 4 days supply | Qty: 120 | Fill #0

## 2016-10-06 MED FILL — AMOX TR-K CLV 875-125 MG TA: 875-125 | 10 days supply | Qty: 20 | Fill #0

## 2016-10-08 DIAGNOSIS — J3089 Other allergic rhinitis: Secondary | ICD-10-CM | POA: Diagnosis not present

## 2016-10-08 DIAGNOSIS — J3081 Allergic rhinitis due to animal (cat) (dog) hair and dander: Secondary | ICD-10-CM | POA: Diagnosis not present

## 2016-10-08 DIAGNOSIS — J4530 Mild persistent asthma, uncomplicated: Secondary | ICD-10-CM | POA: Diagnosis not present

## 2016-10-08 MED FILL — VENTOLIN HFA 90 MCG INHALER: 108 (90 BAS | 17 days supply | Qty: 18 | Fill #0

## 2016-10-08 MED FILL — LEVOCETIRIZINE 5 MG TABLET: 5 | 30 days supply | Qty: 30 | Fill #0

## 2016-10-08 MED FILL — QVAR 80 MCG ORAL INHALER: 80 | 30 days supply | Qty: 9 | Fill #0

## 2016-10-24 MED FILL — OMNARIS 50 MCG NASAL SPRAY: 50 | 30 days supply | Qty: 13 | Fill #2

## 2016-10-28 MED FILL — ZENPEP DR 40,000 UNITS CAP: 40000-12600 | 30 days supply | Qty: 270 | Fill #4

## 2016-10-28 MED FILL — ESCITALOPRAM 10 MG TABLET: 10 | 90 days supply | Qty: 135 | Fill #0

## 2016-11-14 MED FILL — BUPROPION HCL XL 150 MG TAB: 150 | 90 days supply | Qty: 90 | Fill #1

## 2016-11-14 MED FILL — QVAR 80 MCG ORAL INHALER: 80 | 30 days supply | Qty: 9 | Fill #1

## 2016-11-25 MED FILL — ZOLPIDEM TART ER 6.25 MG TA: 6.25 | 30 days supply | Qty: 30 | Fill #1

## 2016-12-22 MED FILL — OMNARIS 50 MCG NASAL SPRAY: 50 | 30 days supply | Qty: 13 | Fill #3

## 2016-12-23 MED FILL — ZENPEP 40000-126000 UNIT CA: 40000-12600 | 30 days supply | Qty: 270 | Fill #0

## 2017-01-02 MED FILL — QVAR REDIHALER 80 MCG/ACT A: 80 | 30 days supply | Qty: 11 | Fill #0

## 2017-01-05 MED FILL — PREVIDENT 5000 BOOSTER PLUS: 1.1 | 30 days supply | Qty: 100 | Fill #0

## 2017-01-07 DIAGNOSIS — F411 Generalized anxiety disorder: Secondary | ICD-10-CM | POA: Diagnosis not present

## 2017-01-07 DIAGNOSIS — R140 Abdominal distension (gaseous): Secondary | ICD-10-CM | POA: Diagnosis not present

## 2017-01-14 DIAGNOSIS — R195 Other fecal abnormalities: Secondary | ICD-10-CM | POA: Diagnosis not present

## 2017-01-14 DIAGNOSIS — R140 Abdominal distension (gaseous): Secondary | ICD-10-CM | POA: Diagnosis not present

## 2017-01-14 DIAGNOSIS — K5010 Crohn's disease of large intestine without complications: Secondary | ICD-10-CM | POA: Diagnosis not present

## 2017-01-22 MED FILL — ESCITALOPRAM 10 MG TABLET: 10 | 90 days supply | Qty: 135 | Fill #1

## 2017-02-05 MED FILL — GAVILYTE-N SOLUTION: 420 | 2 days supply | Qty: 4000 | Fill #0

## 2017-02-06 DIAGNOSIS — K5289 Other specified noninfective gastroenteritis and colitis: Secondary | ICD-10-CM | POA: Diagnosis not present

## 2017-02-06 DIAGNOSIS — R197 Diarrhea, unspecified: Secondary | ICD-10-CM | POA: Diagnosis not present

## 2017-02-06 DIAGNOSIS — K635 Polyp of colon: Secondary | ICD-10-CM | POA: Diagnosis not present

## 2017-02-12 DIAGNOSIS — Z6821 Body mass index (BMI) 21.0-21.9, adult: Secondary | ICD-10-CM | POA: Diagnosis not present

## 2017-02-12 DIAGNOSIS — Z01419 Encounter for gynecological examination (general) (routine) without abnormal findings: Secondary | ICD-10-CM | POA: Diagnosis not present

## 2017-02-12 DIAGNOSIS — R140 Abdominal distension (gaseous): Secondary | ICD-10-CM | POA: Diagnosis not present

## 2017-02-12 DIAGNOSIS — Z3040 Encounter for surveillance of contraceptives, unspecified: Secondary | ICD-10-CM | POA: Diagnosis not present

## 2017-02-12 DIAGNOSIS — Z1231 Encounter for screening mammogram for malignant neoplasm of breast: Secondary | ICD-10-CM | POA: Diagnosis not present

## 2017-02-12 MED FILL — LARIN 21 1-20 TABLET: 1-20 | 84 days supply | Qty: 63 | Fill #0

## 2017-02-13 MED FILL — BUPROPION HCL XL 150 MG TAB: 150 | 90 days supply | Qty: 90 | Fill #0

## 2017-02-18 MED FILL — OMNARIS 50 MCG NASAL SPRAY: 50 | 30 days supply | Qty: 13 | Fill #4

## 2017-02-25 MED FILL — QVAR REDIHALER 80 MCG/ACT A: 80 | 30 days supply | Qty: 11 | Fill #1

## 2017-02-26 MED FILL — ZOLPIDEM TART ER 6.25 MG TA: 6.25 | 30 days supply | Qty: 30 | Fill #0

## 2017-03-02 DIAGNOSIS — K5010 Crohn's disease of large intestine without complications: Secondary | ICD-10-CM | POA: Diagnosis not present

## 2017-03-02 DIAGNOSIS — R140 Abdominal distension (gaseous): Secondary | ICD-10-CM | POA: Diagnosis not present

## 2017-03-03 MED FILL — LIALDA 1.2 GM TABLET SA: 1.2 | 30 days supply | Qty: 60 | Fill #0

## 2017-03-12 MED FILL — PREVIDENT 5000 BOOSTER PLUS: 1.1 | 30 days supply | Qty: 100 | Fill #1

## 2017-03-27 MED FILL — XIIDRA 5% EYE DROPS: 5 | 30 days supply | Qty: 60 | Fill #1

## 2017-03-27 MED FILL — LIALDA 1.2 GM TABLET SA: 1.2 | 30 days supply | Qty: 60 | Fill #1

## 2017-04-08 MED FILL — CHLORHEXIDINE 0.12% RINSE: 0.12 | 17 days supply | Qty: 473 | Fill #0

## 2017-04-08 MED FILL — AZITHROMYCIN 250 MG TABLET: 250 | 1 days supply | Qty: 2 | Fill #0

## 2017-04-17 MED FILL — QVAR REDIHALER 80 MCG/ACT A: 80 | 30 days supply | Qty: 11 | Fill #2

## 2017-04-17 MED FILL — OMNARIS 50 MCG NASAL SPRAY: 50 | 30 days supply | Qty: 13 | Fill #5

## 2017-04-17 MED FILL — ESCITALOPRAM 10 MG TABLET: 10 | 90 days supply | Qty: 135 | Fill #0

## 2017-05-01 MED FILL — LIALDA 1.2 GM TABLET SA: 1.2 | 30 days supply | Qty: 60 | Fill #2

## 2017-05-07 MED FILL — clonazePAM 0.5 MG TABS: 0.5 | 90 days supply | Qty: 90 | Fill #0

## 2017-05-12 MED FILL — buPROPion HCL ER (XL) 150 M: 150 | 90 days supply | Qty: 90 | Fill #1

## 2017-05-20 MED FILL — OMNARIS 50 MCG NASAL SPRAY: 50 | 30 days supply | Qty: 13 | Fill #0

## 2017-05-20 MED FILL — QVAR REDIHALER 80 MCG/ACT A: 80 | 30 days supply | Qty: 11 | Fill #3

## 2017-05-26 MED FILL — AZELASTINE HCL 137 MCG SPRY: 0.1 | 90 days supply | Qty: 60 | Fill #0

## 2017-05-27 MED FILL — LIALDA 1.2 GM TABLET SA: 1.2 | 30 days supply | Qty: 60 | Fill #3

## 2017-06-02 DIAGNOSIS — K5010 Crohn's disease of large intestine without complications: Secondary | ICD-10-CM | POA: Diagnosis not present

## 2017-06-19 MED FILL — OMNARIS 50 MCG NASAL SPRAY: 50 | 30 days supply | Qty: 13 | Fill #1

## 2017-06-19 MED FILL — PREVIDENT 5000 BOOSTER PLUS: 1.1 | 30 days supply | Qty: 100 | Fill #2

## 2017-06-26 MED FILL — QVAR REDIHALER 80 MCG/ACT A: 80 | 30 days supply | Qty: 11 | Fill #4

## 2017-06-26 MED FILL — LIALDA 1.2 GM TABLET SA: 1.2 | 30 days supply | Qty: 60 | Fill #4

## 2017-06-29 DIAGNOSIS — K5090 Crohn's disease, unspecified, without complications: Secondary | ICD-10-CM | POA: Diagnosis not present

## 2017-06-29 DIAGNOSIS — F419 Anxiety disorder, unspecified: Secondary | ICD-10-CM | POA: Diagnosis not present

## 2017-06-29 DIAGNOSIS — R5383 Other fatigue: Secondary | ICD-10-CM | POA: Diagnosis not present

## 2017-06-29 DIAGNOSIS — R140 Abdominal distension (gaseous): Secondary | ICD-10-CM | POA: Diagnosis not present

## 2017-06-29 DIAGNOSIS — E559 Vitamin D deficiency, unspecified: Secondary | ICD-10-CM | POA: Diagnosis not present

## 2017-06-30 MED FILL — XIIDRA 5% EYE DROPS: 5 | 30 days supply | Qty: 60 | Fill #2

## 2017-07-01 MED FILL — ZOLPIDEM TART ER 6.25 MG TA: 6.25 | 30 days supply | Qty: 30 | Fill #1

## 2017-07-08 DIAGNOSIS — R05 Cough: Secondary | ICD-10-CM | POA: Diagnosis not present

## 2017-07-08 DIAGNOSIS — J029 Acute pharyngitis, unspecified: Secondary | ICD-10-CM | POA: Diagnosis not present

## 2017-07-15 DIAGNOSIS — F411 Generalized anxiety disorder: Secondary | ICD-10-CM | POA: Diagnosis not present

## 2017-07-17 MED FILL — ESCITALOPRAM 10 MG TABLET: 10 | 90 days supply | Qty: 135 | Fill #1

## 2017-07-17 MED FILL — LARIN 21 1-20 TABLET: 1-20 | 84 days supply | Qty: 63 | Fill #1

## 2017-07-25 DIAGNOSIS — F419 Anxiety disorder, unspecified: Secondary | ICD-10-CM | POA: Diagnosis not present

## 2017-07-25 DIAGNOSIS — R140 Abdominal distension (gaseous): Secondary | ICD-10-CM | POA: Diagnosis not present

## 2017-07-25 DIAGNOSIS — E559 Vitamin D deficiency, unspecified: Secondary | ICD-10-CM | POA: Diagnosis not present

## 2017-07-25 DIAGNOSIS — R5383 Other fatigue: Secondary | ICD-10-CM | POA: Diagnosis not present

## 2017-07-25 DIAGNOSIS — K5090 Crohn's disease, unspecified, without complications: Secondary | ICD-10-CM | POA: Diagnosis not present

## 2017-07-28 MED FILL — QVAR REDIHALER 80 MCG/ACT A: 80 | 30 days supply | Qty: 11 | Fill #5

## 2017-08-06 MED FILL — OMNARIS 50 MCG NASAL SPRAY: 50 | 30 days supply | Qty: 13 | Fill #2

## 2017-08-06 MED FILL — LIALDA 1.2 GM TABLET SA: 1.2 | 30 days supply | Qty: 60 | Fill #5

## 2017-08-06 MED FILL — AZELASTINE HCL 137 MCG SPRY: 0.1 | 90 days supply | Qty: 60 | Fill #1

## 2017-08-14 ENCOUNTER — Other Ambulatory Visit: Payer: Self-pay | Admitting: Family Medicine

## 2017-08-14 ENCOUNTER — Ambulatory Visit
Admission: RE | Admit: 2017-08-14 | Discharge: 2017-08-14 | Disposition: A | Discharge status: Home / Self Care | Payer: 59 | Source: Ambulatory Visit | Attending: Family Medicine | Admitting: Family Medicine

## 2017-08-14 DIAGNOSIS — H524 Presbyopia: Secondary | ICD-10-CM | POA: Diagnosis not present

## 2017-08-14 DIAGNOSIS — S20211A Contusion of right front wall of thorax, initial encounter: Secondary | ICD-10-CM

## 2017-08-14 DIAGNOSIS — R0789 Other chest pain: Secondary | ICD-10-CM | POA: Diagnosis not present

## 2017-08-14 DIAGNOSIS — S299XXA Unspecified injury of thorax, initial encounter: Secondary | ICD-10-CM | POA: Diagnosis not present

## 2017-08-14 DIAGNOSIS — H5213 Myopia, bilateral: Secondary | ICD-10-CM | POA: Diagnosis not present

## 2017-08-14 DIAGNOSIS — H52223 Regular astigmatism, bilateral: Secondary | ICD-10-CM | POA: Diagnosis not present

## 2017-08-14 DIAGNOSIS — W108XXA Fall (on) (from) other stairs and steps, initial encounter: Secondary | ICD-10-CM | POA: Diagnosis not present

## 2017-08-14 MED FILL — CYCLOBENZAPRINE 10 MG TAB: 10 | 13 days supply | Qty: 30 | Fill #0

## 2017-08-14 MED FILL — BUPROPION HCL XL 150 MG TAB: 150 | 90 days supply | Qty: 90 | Fill #0

## 2017-09-02 MED FILL — LIALDA 1.2 GM TABLET SA: 1.2 | 30 days supply | Qty: 60 | Fill #6

## 2017-09-02 MED FILL — QVAR REDIHALER 80 MCG/ACT A: 80 | 30 days supply | Qty: 11 | Fill #0

## 2017-09-15 MED FILL — XIIDRA 5% EYE DROPS: 5 | 30 days supply | Qty: 60 | Fill #0

## 2017-09-23 DIAGNOSIS — R140 Abdominal distension (gaseous): Secondary | ICD-10-CM | POA: Diagnosis not present

## 2017-09-25 DIAGNOSIS — R140 Abdominal distension (gaseous): Secondary | ICD-10-CM | POA: Diagnosis not present

## 2017-09-28 DIAGNOSIS — R140 Abdominal distension (gaseous): Secondary | ICD-10-CM | POA: Diagnosis not present

## 2017-09-29 DIAGNOSIS — R140 Abdominal distension (gaseous): Secondary | ICD-10-CM | POA: Diagnosis not present

## 2017-10-02 MED FILL — LIALDA 1.2 GM TABLET SA: 1.2 | 30 days supply | Qty: 60 | Fill #7

## 2017-10-02 MED FILL — OMNARIS 50 MCG/ACT SUSP: 50 | 30 days supply | Qty: 13 | Fill #0

## 2017-10-02 MED FILL — LARIN 21 1-20 TABLET: 1-20 | 84 days supply | Qty: 63 | Fill #2

## 2017-10-06 DIAGNOSIS — J4530 Mild persistent asthma, uncomplicated: Secondary | ICD-10-CM | POA: Diagnosis not present

## 2017-10-06 DIAGNOSIS — J3081 Allergic rhinitis due to animal (cat) (dog) hair and dander: Secondary | ICD-10-CM | POA: Diagnosis not present

## 2017-10-06 DIAGNOSIS — J3089 Other allergic rhinitis: Secondary | ICD-10-CM | POA: Diagnosis not present

## 2017-10-06 MED FILL — LEVOCETIRIZINE 5 MG TABLET: 5 | 30 days supply | Qty: 30 | Fill #0

## 2017-10-06 MED FILL — QVAR REDIHALER 80 MCG/ACT A: 80 | 30 days supply | Qty: 11 | Fill #0

## 2017-10-12 MED FILL — PREVIDENT 5000 BOOSTER PLUS: 1.1 | 30 days supply | Qty: 100 | Fill #3

## 2017-10-26 MED FILL — ESCITALOPRAM 10 MG TABLET: 10 | 90 days supply | Qty: 135 | Fill #0

## 2017-11-02 MED FILL — LIALDA 1.2 GM TABLET SA: 1.2 | 30 days supply | Qty: 60 | Fill #8

## 2017-11-12 MED FILL — buPROPion HCL ER (XL) 150 M: 150 | 90 days supply | Qty: 90 | Fill #1

## 2017-11-12 MED FILL — LEVOCETIRIZINE 5 MG TABLET: 5 | 30 days supply | Qty: 30 | Fill #1

## 2017-11-13 DIAGNOSIS — R5383 Other fatigue: Secondary | ICD-10-CM | POA: Diagnosis not present

## 2017-11-13 DIAGNOSIS — E039 Hypothyroidism, unspecified: Secondary | ICD-10-CM | POA: Diagnosis not present

## 2017-11-18 MED FILL — QVAR REDIHALER 80 MCG/ACT A: 80 | 30 days supply | Qty: 11 | Fill #1

## 2017-11-27 MED FILL — XIIDRA 5% EYE DROPS: 5 | 30 days supply | Qty: 60 | Fill #1

## 2017-11-27 MED FILL — metroNIDAZOLE 500 MG TABS: 500 | 10 days supply | Qty: 20 | Fill #0

## 2017-11-27 MED FILL — PRAZIQUANTEL 600 MG TABS: 600 | 21 days supply | Qty: 2 | Fill #0

## 2017-12-04 MED FILL — LIALDA 1.2 GM TABLET SA: 1.2 | 30 days supply | Qty: 60 | Fill #9

## 2017-12-18 MED FILL — LEVOCETIRIZINE 5 MG TABLET: 5 | 30 days supply | Qty: 30 | Fill #2

## 2017-12-21 MED FILL — ZOLPIDEM TART ER 6.25 MG TA: 6.25 | 30 days supply | Qty: 30 | Fill #0

## 2017-12-31 MED FILL — LARIN 21 1-20 TABLET: 1-20 | 84 days supply | Qty: 63 | Fill #3

## 2018-01-04 MED FILL — PRAZIQUANTEL 600 MG TABS: 600 | 21 days supply | Qty: 2 | Fill #1

## 2018-01-04 MED FILL — QVAR REDIHALER 80 MCG/ACT A: 80 | 30 days supply | Qty: 11 | Fill #2

## 2018-01-04 MED FILL — LIALDA 1.2 GM TABLET SA: 1.2 | 30 days supply | Qty: 60 | Fill #10

## 2018-01-07 MED FILL — OMNARIS 50 MCG/ACT SUSP: 50 | 30 days supply | Qty: 13 | Fill #1

## 2018-01-13 DIAGNOSIS — F411 Generalized anxiety disorder: Secondary | ICD-10-CM | POA: Diagnosis not present

## 2018-01-20 MED FILL — LEVOCETIRIZINE 5 MG TABLET: 5 | 30 days supply | Qty: 30 | Fill #3

## 2018-01-20 MED FILL — XIIDRA 5% EYE DROPS: 5 | 30 days supply | Qty: 60 | Fill #2

## 2018-01-20 MED FILL — ESCITALOPRAM 10 MG TABLET: 10 | 90 days supply | Qty: 135 | Fill #1

## 2018-01-27 MED FILL — PREVIDENT 5000 BOOSTER PLUS: 1.1 | 30 days supply | Qty: 100 | Fill #0

## 2018-01-28 MED FILL — LIALDA 1.2 GM TABLET SA: 1.2 | 30 days supply | Qty: 60 | Fill #11

## 2018-01-28 MED FILL — VENTOLIN HFA 90 MCG INHALER: 108 (90 BAS | 17 days supply | Qty: 18 | Fill #0

## 2018-01-29 MED FILL — AZELASTINE HCL 137 MCG SPRY: 0.1 | 90 days supply | Qty: 60 | Fill #2

## 2018-02-03 MED FILL — QVAR REDIHALER 80 MCG/ACT A: 80 | 30 days supply | Qty: 11 | Fill #3

## 2018-02-08 MED FILL — BUPROPION HCL XL 150 MG TAB: 150 | 90 days supply | Qty: 90 | Fill #0

## 2018-03-01 MED FILL — QVAR REDIHALER 80 MCG/ACT A: 80 | 30 days supply | Qty: 11 | Fill #4

## 2018-03-01 MED FILL — LEVOCETIRIZINE 5 MG TABLET: 5 | 30 days supply | Qty: 30 | Fill #4

## 2018-03-01 MED FILL — OMNARIS 50 MCG/ACT SUSP: 50 | 30 days supply | Qty: 13 | Fill #2

## 2018-03-04 MED FILL — LIALDA 1.2 GM TABLET SA: 1.2 | 30 days supply | Qty: 60 | Fill #0

## 2018-03-05 MED FILL — ZOLPIDEM TART ER 6.25 MG TA: 6.25 | 30 days supply | Qty: 30 | Fill #1

## 2018-03-29 MED FILL — LARIN 21 1-20 TABLET: 1-20 | 28 days supply | Qty: 21 | Fill #0

## 2018-04-01 MED FILL — clonazePAM 0.5 MG TABS: 0.5 | 30 days supply | Qty: 90 | Fill #0

## 2018-04-01 MED FILL — LEVOCETIRIZINE 5 MG TABLET: 5 | 30 days supply | Qty: 30 | Fill #5

## 2018-04-09 DIAGNOSIS — Z6821 Body mass index (BMI) 21.0-21.9, adult: Secondary | ICD-10-CM | POA: Diagnosis not present

## 2018-04-09 DIAGNOSIS — Z3040 Encounter for surveillance of contraceptives, unspecified: Secondary | ICD-10-CM | POA: Diagnosis not present

## 2018-04-09 DIAGNOSIS — Z1231 Encounter for screening mammogram for malignant neoplasm of breast: Secondary | ICD-10-CM | POA: Diagnosis not present

## 2018-04-09 DIAGNOSIS — Z01419 Encounter for gynecological examination (general) (routine) without abnormal findings: Secondary | ICD-10-CM | POA: Diagnosis not present

## 2018-04-15 MED FILL — XIIDRA 5% EYE DROPS: 5 | 30 days supply | Qty: 60 | Fill #3

## 2018-06-21 MED FILL — PREVIDENT 5000 BOOSTER PLUS: 1.1 | 30 days supply | Qty: 100 | Fill #1

## 2018-06-21 MED FILL — XIIDRA 5% EYE DROPS: 5 | 30 days supply | Qty: 60 | Fill #0

## 2018-06-24 MED FILL — ESCITALOPRAM 10 MG TABLET: 10 | 30 days supply | Qty: 45 | Fill #0

## 2018-07-22 DIAGNOSIS — F411 Generalized anxiety disorder: Secondary | ICD-10-CM | POA: Diagnosis not present

## 2018-07-22 DIAGNOSIS — Z23 Encounter for immunization: Secondary | ICD-10-CM | POA: Diagnosis not present

## 2018-07-23 MED FILL — ESCITALOPRAM 10 MG TABLET: 10 | 30 days supply | Qty: 45 | Fill #0

## 2018-09-06 MED FILL — XIIDRA 5% EYE DROPS: 5 | 30 days supply | Qty: 60 | Fill #0

## 2018-09-30 MED FILL — XIIDRA 5% EYE DROPS: 5 | 30 days supply | Qty: 60 | Fill #0

## 2018-10-06 MED FILL — PEG-3350 SOLUTION: 420 | 1 days supply | Qty: 4000 | Fill #0

## 2018-10-07 DIAGNOSIS — J3081 Allergic rhinitis due to animal (cat) (dog) hair and dander: Secondary | ICD-10-CM | POA: Diagnosis not present

## 2018-10-07 DIAGNOSIS — J3089 Other allergic rhinitis: Secondary | ICD-10-CM | POA: Diagnosis not present

## 2018-10-07 DIAGNOSIS — J4530 Mild persistent asthma, uncomplicated: Secondary | ICD-10-CM | POA: Diagnosis not present

## 2018-10-08 DIAGNOSIS — K5010 Crohn's disease of large intestine without complications: Secondary | ICD-10-CM | POA: Diagnosis not present

## 2018-10-08 DIAGNOSIS — K5140 Inflammatory polyps of colon without complications: Secondary | ICD-10-CM | POA: Diagnosis not present

## 2018-10-12 DIAGNOSIS — K5010 Crohn's disease of large intestine without complications: Secondary | ICD-10-CM | POA: Diagnosis not present

## 2018-10-25 MED FILL — ESCITALOPRAM 10 MG TABLET: 10 | 30 days supply | Qty: 45 | Fill #0

## 2018-11-12 DIAGNOSIS — I8312 Varicose veins of left lower extremity with inflammation: Secondary | ICD-10-CM | POA: Diagnosis not present

## 2018-11-17 MED FILL — buPROPion HCL ER (XL) 150 M: 150 | 30 days supply | Qty: 30 | Fill #0

## 2018-12-03 MED FILL — VENTOLIN HFA 90 MCG INHALER: 108 (90 BAS | 17 days supply | Qty: 18 | Fill #0

## 2018-12-27 MED FILL — XIIDRA 5% EYE DROPS: 5 | 30 days supply | Qty: 60 | Fill #1

## 2019-02-09 DIAGNOSIS — G4710 Hypersomnia, unspecified: Secondary | ICD-10-CM | POA: Diagnosis not present

## 2019-02-09 DIAGNOSIS — F411 Generalized anxiety disorder: Secondary | ICD-10-CM | POA: Diagnosis not present

## 2019-02-16 DIAGNOSIS — G4710 Hypersomnia, unspecified: Secondary | ICD-10-CM | POA: Diagnosis not present

## 2019-02-16 DIAGNOSIS — R7301 Impaired fasting glucose: Secondary | ICD-10-CM | POA: Diagnosis not present

## 2019-03-15 MED FILL — XIIDRA 5% EYE DROPS: 5 | 30 days supply | Qty: 60 | Fill #2

## 2019-03-30 MED FILL — XIIDRA 5% EYE DROPS: 5 | 30 days supply | Qty: 60 | Fill #2

## 2019-04-04 MED FILL — NATURE-THROID 97.5 MG TAB: 97.5 | 30 days supply | Qty: 30 | Fill #0

## 2019-05-11 MED FILL — NATURE-THROID 97.5 MG TAB: 97.5 | 30 days supply | Qty: 30 | Fill #0

## 2019-05-17 MED FILL — XIIDRA 5% EYE DROPS: 5 | 30 days supply | Qty: 60 | Fill #0

## 2019-05-18 DIAGNOSIS — R5383 Other fatigue: Secondary | ICD-10-CM | POA: Diagnosis not present

## 2019-05-18 DIAGNOSIS — E039 Hypothyroidism, unspecified: Secondary | ICD-10-CM | POA: Diagnosis not present

## 2019-05-25 DIAGNOSIS — R5383 Other fatigue: Secondary | ICD-10-CM | POA: Diagnosis not present

## 2019-05-25 DIAGNOSIS — E039 Hypothyroidism, unspecified: Secondary | ICD-10-CM | POA: Diagnosis not present

## 2019-05-25 DIAGNOSIS — E559 Vitamin D deficiency, unspecified: Secondary | ICD-10-CM | POA: Diagnosis not present

## 2019-05-25 DIAGNOSIS — K5090 Crohn's disease, unspecified, without complications: Secondary | ICD-10-CM | POA: Diagnosis not present

## 2019-06-29 DIAGNOSIS — Z124 Encounter for screening for malignant neoplasm of cervix: Secondary | ICD-10-CM | POA: Diagnosis not present

## 2019-06-29 DIAGNOSIS — Z1231 Encounter for screening mammogram for malignant neoplasm of breast: Secondary | ICD-10-CM | POA: Diagnosis not present

## 2019-06-29 DIAGNOSIS — Z01419 Encounter for gynecological examination (general) (routine) without abnormal findings: Secondary | ICD-10-CM | POA: Diagnosis not present

## 2019-06-29 DIAGNOSIS — Z3040 Encounter for surveillance of contraceptives, unspecified: Secondary | ICD-10-CM | POA: Diagnosis not present

## 2019-06-30 DIAGNOSIS — G4710 Hypersomnia, unspecified: Secondary | ICD-10-CM | POA: Diagnosis not present

## 2019-06-30 DIAGNOSIS — G4719 Other hypersomnia: Secondary | ICD-10-CM | POA: Diagnosis not present

## 2019-07-06 ENCOUNTER — Other Ambulatory Visit (HOSPITAL_BASED_OUTPATIENT_CLINIC_OR_DEPARTMENT_OTHER): Payer: Self-pay

## 2019-07-06 DIAGNOSIS — G4710 Hypersomnia, unspecified: Secondary | ICD-10-CM

## 2019-07-19 MED FILL — clonazePAM 0.5 MG TABS: 0.5 | 10 days supply | Qty: 30 | Fill #0

## 2019-07-25 ENCOUNTER — Other Ambulatory Visit (HOSPITAL_COMMUNITY)
Admission: RE | Admit: 2019-07-25 | Discharge: 2019-07-25 | Disposition: A | Discharge status: Home / Self Care | Payer: BLUE CROSS/BLUE SHIELD | Source: Ambulatory Visit | Attending: Internal Medicine | Admitting: Internal Medicine

## 2019-07-25 DIAGNOSIS — Z01812 Encounter for preprocedural laboratory examination: Secondary | ICD-10-CM | POA: Insufficient documentation

## 2019-07-25 DIAGNOSIS — Z20828 Contact with and (suspected) exposure to other viral communicable diseases: Secondary | ICD-10-CM | POA: Diagnosis not present

## 2019-07-25 LAB — SARS CORONAVIRUS 2 (TAT 6-24 HRS): SARS Coronavirus 2: NEGATIVE

## 2019-07-26 ENCOUNTER — Encounter (HOSPITAL_BASED_OUTPATIENT_CLINIC_OR_DEPARTMENT_OTHER): Payer: 59 | Admitting: Internal Medicine

## 2019-07-26 DIAGNOSIS — F458 Other somatoform disorders: Secondary | ICD-10-CM | POA: Diagnosis not present

## 2019-07-26 DIAGNOSIS — G43009 Migraine without aura, not intractable, without status migrainosus: Secondary | ICD-10-CM | POA: Diagnosis not present

## 2019-07-26 DIAGNOSIS — G501 Atypical facial pain: Secondary | ICD-10-CM | POA: Diagnosis not present

## 2019-07-27 ENCOUNTER — Encounter (HOSPITAL_BASED_OUTPATIENT_CLINIC_OR_DEPARTMENT_OTHER): Payer: 59 | Admitting: Internal Medicine

## 2019-07-27 DIAGNOSIS — G501 Atypical facial pain: Secondary | ICD-10-CM | POA: Diagnosis not present

## 2019-07-27 DIAGNOSIS — F411 Generalized anxiety disorder: Secondary | ICD-10-CM | POA: Diagnosis not present

## 2019-07-27 DIAGNOSIS — F458 Other somatoform disorders: Secondary | ICD-10-CM | POA: Diagnosis not present

## 2019-07-27 DIAGNOSIS — G43009 Migraine without aura, not intractable, without status migrainosus: Secondary | ICD-10-CM | POA: Diagnosis not present

## 2019-08-08 DIAGNOSIS — R921 Mammographic calcification found on diagnostic imaging of breast: Secondary | ICD-10-CM | POA: Diagnosis not present

## 2019-08-08 MED FILL — XIIDRA 5 % SOLN: 5 | 30 days supply | Qty: 60 | Fill #1

## 2019-08-11 MED FILL — buPROPion HCL 75 MG TABS: 75 | 30 days supply | Qty: 60 | Fill #0

## 2019-08-24 MED FILL — ESCITALOPRAM 10 MG TABLET: 10 | 30 days supply | Qty: 30 | Fill #0

## 2019-09-06 MED FILL — PREVIDENT 5000 BOOSTER PLUS: 1.1 | 30 days supply | Qty: 100 | Fill #0

## 2019-09-08 MED FILL — buPROPion HCL 75 MG TABS: 75 | 30 days supply | Qty: 60 | Fill #0

## 2019-10-20 DIAGNOSIS — J4530 Mild persistent asthma, uncomplicated: Secondary | ICD-10-CM | POA: Diagnosis not present

## 2019-10-20 DIAGNOSIS — J3089 Other allergic rhinitis: Secondary | ICD-10-CM | POA: Diagnosis not present

## 2019-10-20 DIAGNOSIS — J3081 Allergic rhinitis due to animal (cat) (dog) hair and dander: Secondary | ICD-10-CM | POA: Diagnosis not present

## 2019-10-26 MED FILL — XIIDRA 5 % SOLN: 5 | 30 days supply | Qty: 60 | Fill #2

## 2019-11-04 DIAGNOSIS — G43009 Migraine without aura, not intractable, without status migrainosus: Secondary | ICD-10-CM | POA: Diagnosis not present

## 2019-11-04 DIAGNOSIS — F458 Other somatoform disorders: Secondary | ICD-10-CM | POA: Diagnosis not present

## 2019-11-04 DIAGNOSIS — G501 Atypical facial pain: Secondary | ICD-10-CM | POA: Diagnosis not present

## 2020-01-10 DIAGNOSIS — F458 Other somatoform disorders: Secondary | ICD-10-CM | POA: Diagnosis not present

## 2020-01-11 MED FILL — XIIDRA 5 % SOLN: 5 | 30 days supply | Qty: 60 | Fill #0

## 2020-01-31 DIAGNOSIS — F458 Other somatoform disorders: Secondary | ICD-10-CM | POA: Diagnosis not present

## 2020-01-31 DIAGNOSIS — G501 Atypical facial pain: Secondary | ICD-10-CM | POA: Diagnosis not present

## 2020-01-31 DIAGNOSIS — G43009 Migraine without aura, not intractable, without status migrainosus: Secondary | ICD-10-CM | POA: Diagnosis not present

## 2020-03-13 MED FILL — XIIDRA 5 % SOLN: 5 | 30 days supply | Qty: 60 | Fill #1

## 2020-04-04 DIAGNOSIS — R921 Mammographic calcification found on diagnostic imaging of breast: Secondary | ICD-10-CM | POA: Diagnosis not present

## 2020-05-16 DIAGNOSIS — K5090 Crohn's disease, unspecified, without complications: Secondary | ICD-10-CM | POA: Diagnosis not present

## 2020-05-16 DIAGNOSIS — R5383 Other fatigue: Secondary | ICD-10-CM | POA: Diagnosis not present

## 2020-05-16 DIAGNOSIS — E039 Hypothyroidism, unspecified: Secondary | ICD-10-CM | POA: Diagnosis not present

## 2020-05-16 DIAGNOSIS — E559 Vitamin D deficiency, unspecified: Secondary | ICD-10-CM | POA: Diagnosis not present

## 2020-05-24 MED FILL — XIIDRA 5 % SOLN: 5 | 30 days supply | Qty: 60 | Fill #2

## 2020-06-06 DIAGNOSIS — E039 Hypothyroidism, unspecified: Secondary | ICD-10-CM | POA: Diagnosis not present

## 2020-06-06 DIAGNOSIS — K5090 Crohn's disease, unspecified, without complications: Secondary | ICD-10-CM | POA: Diagnosis not present

## 2020-06-06 DIAGNOSIS — R5383 Other fatigue: Secondary | ICD-10-CM | POA: Diagnosis not present

## 2020-06-06 DIAGNOSIS — F419 Anxiety disorder, unspecified: Secondary | ICD-10-CM | POA: Diagnosis not present

## 2020-06-20 DIAGNOSIS — I87323 Chronic venous hypertension (idiopathic) with inflammation of bilateral lower extremity: Secondary | ICD-10-CM | POA: Diagnosis not present

## 2020-07-25 DIAGNOSIS — K5010 Crohn's disease of large intestine without complications: Secondary | ICD-10-CM | POA: Diagnosis not present

## 2020-07-27 ENCOUNTER — Other Ambulatory Visit (HOSPITAL_COMMUNITY): Payer: Self-pay | Admitting: Optometry

## 2020-07-27 MED FILL — XIIDRA 5 % SOLN: 5 | 30 days supply | Qty: 60 | Fill #0

## 2020-08-15 DIAGNOSIS — Z01419 Encounter for gynecological examination (general) (routine) without abnormal findings: Secondary | ICD-10-CM | POA: Diagnosis not present

## 2020-08-15 DIAGNOSIS — Z1231 Encounter for screening mammogram for malignant neoplasm of breast: Secondary | ICD-10-CM | POA: Diagnosis not present

## 2020-08-15 DIAGNOSIS — Z1211 Encounter for screening for malignant neoplasm of colon: Secondary | ICD-10-CM | POA: Diagnosis not present

## 2020-08-15 DIAGNOSIS — Z3040 Encounter for surveillance of contraceptives, unspecified: Secondary | ICD-10-CM | POA: Diagnosis not present

## 2020-08-23 DIAGNOSIS — M79605 Pain in left leg: Secondary | ICD-10-CM | POA: Diagnosis not present

## 2020-08-23 DIAGNOSIS — M79604 Pain in right leg: Secondary | ICD-10-CM | POA: Diagnosis not present

## 2020-09-19 DIAGNOSIS — F458 Other somatoform disorders: Secondary | ICD-10-CM | POA: Diagnosis not present

## 2020-09-27 MED FILL — XIIDRA 5 % SOLN: 5 | 30 days supply | Qty: 60 | Fill #1

## 2020-10-12 ENCOUNTER — Other Ambulatory Visit (HOSPITAL_COMMUNITY): Payer: Self-pay | Admitting: Family Medicine

## 2020-10-12 MED FILL — NP THYROID 120 MG TABLET: 120 | 30 days supply | Qty: 30 | Fill #0

## 2020-10-25 DIAGNOSIS — J3089 Other allergic rhinitis: Secondary | ICD-10-CM | POA: Diagnosis not present

## 2020-10-25 DIAGNOSIS — J3081 Allergic rhinitis due to animal (cat) (dog) hair and dander: Secondary | ICD-10-CM | POA: Diagnosis not present

## 2020-10-25 DIAGNOSIS — J4530 Mild persistent asthma, uncomplicated: Secondary | ICD-10-CM | POA: Diagnosis not present

## 2020-11-28 MED FILL — XIIDRA 5 % SOLN: 5 | 30 days supply | Qty: 60 | Fill #2

## 2020-12-13 DIAGNOSIS — M26603 Bilateral temporomandibular joint disorder, unspecified: Secondary | ICD-10-CM | POA: Diagnosis not present

## 2021-01-04 DIAGNOSIS — K5140 Inflammatory polyps of colon without complications: Secondary | ICD-10-CM | POA: Diagnosis not present

## 2021-01-04 DIAGNOSIS — K5010 Crohn's disease of large intestine without complications: Secondary | ICD-10-CM | POA: Diagnosis not present

## 2021-01-16 DIAGNOSIS — M25561 Pain in right knee: Secondary | ICD-10-CM | POA: Diagnosis not present

## 2021-01-23 ENCOUNTER — Other Ambulatory Visit (HOSPITAL_COMMUNITY): Payer: Self-pay

## 2021-01-23 MED ORDER — XIIDRA 5 % OP SOLN
1.0000 [drp] | Freq: Two times a day (BID) | OPHTHALMIC | 2 refills | Status: AC
  Filled 2021-01-23 – 2021-01-31 (×2): qty 60, 30d supply, fill #0
  Filled 2021-03-21: qty 60, 30d supply, fill #1
  Filled 2021-05-21: qty 60, 30d supply, fill #2
  Filled 2021-07-10: qty 60, 30d supply, fill #3

## 2021-01-28 DIAGNOSIS — M2241 Chondromalacia patellae, right knee: Secondary | ICD-10-CM | POA: Diagnosis not present

## 2021-01-30 DIAGNOSIS — M2241 Chondromalacia patellae, right knee: Secondary | ICD-10-CM | POA: Diagnosis not present

## 2021-01-31 ENCOUNTER — Other Ambulatory Visit (HOSPITAL_COMMUNITY): Payer: Self-pay

## 2021-02-11 DIAGNOSIS — M2241 Chondromalacia patellae, right knee: Secondary | ICD-10-CM | POA: Diagnosis not present

## 2021-02-14 DIAGNOSIS — M2241 Chondromalacia patellae, right knee: Secondary | ICD-10-CM | POA: Diagnosis not present

## 2021-02-17 MED FILL — Thyroid Tab 120 MG (2 Grain): ORAL | 30 days supply | Qty: 30 | Fill #0 | Status: AC

## 2021-02-18 ENCOUNTER — Other Ambulatory Visit (HOSPITAL_COMMUNITY): Payer: Self-pay

## 2021-02-18 DIAGNOSIS — M2241 Chondromalacia patellae, right knee: Secondary | ICD-10-CM | POA: Diagnosis not present

## 2021-02-20 ENCOUNTER — Other Ambulatory Visit (HOSPITAL_COMMUNITY): Payer: Self-pay

## 2021-02-20 DIAGNOSIS — M25561 Pain in right knee: Secondary | ICD-10-CM | POA: Diagnosis not present

## 2021-02-20 DIAGNOSIS — M2241 Chondromalacia patellae, right knee: Secondary | ICD-10-CM | POA: Diagnosis not present

## 2021-02-25 DIAGNOSIS — M2241 Chondromalacia patellae, right knee: Secondary | ICD-10-CM | POA: Diagnosis not present

## 2021-02-28 DIAGNOSIS — M2241 Chondromalacia patellae, right knee: Secondary | ICD-10-CM | POA: Diagnosis not present

## 2021-03-04 DIAGNOSIS — M2241 Chondromalacia patellae, right knee: Secondary | ICD-10-CM | POA: Diagnosis not present

## 2021-03-07 DIAGNOSIS — M2241 Chondromalacia patellae, right knee: Secondary | ICD-10-CM | POA: Diagnosis not present

## 2021-03-17 DIAGNOSIS — Z20822 Contact with and (suspected) exposure to covid-19: Secondary | ICD-10-CM | POA: Diagnosis not present

## 2021-03-17 DIAGNOSIS — U071 COVID-19: Secondary | ICD-10-CM | POA: Diagnosis not present

## 2021-03-17 DIAGNOSIS — J069 Acute upper respiratory infection, unspecified: Secondary | ICD-10-CM | POA: Diagnosis not present

## 2021-03-17 DIAGNOSIS — R059 Cough, unspecified: Secondary | ICD-10-CM | POA: Diagnosis not present

## 2021-03-22 ENCOUNTER — Other Ambulatory Visit (HOSPITAL_COMMUNITY): Payer: Self-pay

## 2021-03-24 MED FILL — Thyroid Tab 120 MG (2 Grain): ORAL | 30 days supply | Qty: 30 | Fill #1 | Status: AC

## 2021-03-25 ENCOUNTER — Other Ambulatory Visit (HOSPITAL_COMMUNITY): Payer: Self-pay

## 2021-04-06 ENCOUNTER — Emergency Department (HOSPITAL_BASED_OUTPATIENT_CLINIC_OR_DEPARTMENT_OTHER)
Admission: EM | Admit: 2021-04-06 | Discharge: 2021-04-06 | Disposition: A | Discharge status: Home / Self Care | Payer: BC Managed Care – PPO | Attending: Emergency Medicine | Admitting: Emergency Medicine

## 2021-04-06 ENCOUNTER — Encounter (HOSPITAL_BASED_OUTPATIENT_CLINIC_OR_DEPARTMENT_OTHER): Payer: Self-pay | Admitting: *Deleted

## 2021-04-06 ENCOUNTER — Emergency Department (HOSPITAL_BASED_OUTPATIENT_CLINIC_OR_DEPARTMENT_OTHER): Payer: BC Managed Care – PPO | Admitting: Radiology

## 2021-04-06 DIAGNOSIS — M25511 Pain in right shoulder: Secondary | ICD-10-CM

## 2021-04-06 DIAGNOSIS — S060X9A Concussion with loss of consciousness of unspecified duration, initial encounter: Secondary | ICD-10-CM | POA: Diagnosis not present

## 2021-04-06 DIAGNOSIS — M79641 Pain in right hand: Secondary | ICD-10-CM | POA: Diagnosis not present

## 2021-04-06 DIAGNOSIS — S0990XA Unspecified injury of head, initial encounter: Secondary | ICD-10-CM | POA: Diagnosis not present

## 2021-04-06 DIAGNOSIS — Z7951 Long term (current) use of inhaled steroids: Secondary | ICD-10-CM | POA: Diagnosis not present

## 2021-04-06 DIAGNOSIS — Y9301 Activity, walking, marching and hiking: Secondary | ICD-10-CM

## 2021-04-06 DIAGNOSIS — M25531 Pain in right wrist: Secondary | ICD-10-CM | POA: Diagnosis not present

## 2021-04-06 DIAGNOSIS — W19XXXA Unspecified fall, initial encounter: Secondary | ICD-10-CM

## 2021-04-06 DIAGNOSIS — R404 Transient alteration of awareness: Secondary | ICD-10-CM | POA: Diagnosis not present

## 2021-04-06 DIAGNOSIS — J45909 Unspecified asthma, uncomplicated: Secondary | ICD-10-CM | POA: Insufficient documentation

## 2021-04-06 DIAGNOSIS — W01198A Fall on same level from slipping, tripping and stumbling with subsequent striking against other object, initial encounter: Secondary | ICD-10-CM | POA: Insufficient documentation

## 2021-04-06 DIAGNOSIS — S70311A Abrasion, right thigh, initial encounter: Secondary | ICD-10-CM | POA: Diagnosis not present

## 2021-04-06 DIAGNOSIS — T07XXXA Unspecified multiple injuries, initial encounter: Secondary | ICD-10-CM

## 2021-04-06 NOTE — Discharge Instructions (Addendum)
Your history, exam, work-up today are consistent with a fall while hiking leaving primarily concussion and soft tissue injuries.  Your x-ray of the wrist and shoulder did not show acute bony injury but clinically we placed you in a brace in the event of occult or hidden wrist injury.  Please follow-up with hand surgery for this if it is persistent.  We also discussed  how you likely had a postconcussive headache syndrome causing you to feel dazed for several minutes.  We had a long shared decision-making conversation and agreed to hold on head CT imaging at this time but if any symptoms were to change or worsen, return to the nearest emergency department for likely head CT.  Please rest and stay hydrated and follow-up with your primary doctor.  Please follow-up with your PCP for some of your waxing and waning sinus symptoms as well.  If any symptoms change or worsen, please return.

## 2021-04-06 NOTE — ED Notes (Signed)
Ice pack applied to right wrist

## 2021-04-06 NOTE — ED Notes (Signed)
Pt Stacie Kaufman Blurry vision, N/V and headache. Pt stated that she feels fine and she has been  Reading her book

## 2021-04-06 NOTE — ED Triage Notes (Signed)
Out hiking trail and had a fall, states she tripped and hit her jaw and rt hand pain, rt shoulder pain. Questionable LOC.

## 2021-04-06 NOTE — ED Provider Notes (Signed)
[facility] DE[facility] Provider Note   CSN: [id number] Arrival date & time: 04/06/21  1237     History Chief Complaint  Patient presents with   Stacie Kaufman    Stacie Kaufman is a 50 y.o. female.  The history is provided by the patient and the spouse.  Fall This is a new problem. The current episode started 3 to 5 hours ago. The problem occurs rarely. The problem has not changed since onset.Associated symptoms include headaches (improved). Pertinent negatives include no chest pain, no abdominal pain and no shortness of breath. Nothing aggravates the symptoms. Nothing relieves the symptoms. She has tried nothing for the symptoms. The treatment provided no relief.      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     Family History  Problem Relation Age of Onset   Anesthesia problems Mother        post-op nausea    Social History   Tobacco Use   Smoking status: Never   Smokeless tobacco: Never  Substance Use Topics   Alcohol use: Yes    Comment: occasionally   Drug use: No     Home Medications Prior to Admission medications   Medication Sig Start Date End Date Taking? Authorizing Provider  acetaminophen (TYLENOL) 500 MG tablet Take 500 mg by mouth every 6 (six) hours as needed. Pain    [provider]  albuterol (PROVENTIL HFA;VENTOLIN HFA) 108 (90 BASE) MCG/ACT inhaler Inhale 2 puffs into the lungs as needed. Before exercise    [provider]  ALPRAZolam (XANAX) 0.25 MG tablet Take 0.25 mg by mouth at bedtime as needed. Anxiety    [provider]  amoxicillin (AMOXIL) 500 MG tablet Take 2 tablets (1,000 mg total) by mouth 2 (two) times daily. 09/30/16   Kennyth Arnold, FNP  amoxicillin-clavulanate (AUGMENTIN) 875-125 MG tablet Take 1 tablet by mouth 2 (two) times daily. 09/30/16   Dutch Quint B, FNP  B Complex-Biotin-FA (B-COMPLEX PO) Take 1 tablet by mouth daily.    [provider]  buPROPion (WELLBUTRIN XL) 150 MG 24 hr tablet Take 150 mg by mouth daily.    [provider]  cholecalciferol (VITAMIN D) 1000 UNITS tablet Take 2,000 Units by mouth daily.    [provider]  clonazePAM (KLONOPIN) 0.5 MG tablet Take 0.5 mg by mouth 2 (two) times daily as needed. Takes 5 nights/week    [provider]  cycloSPORINE (RESTASIS) 0.05 % ophthalmic  emulsion Place 2 drops into both eyes 2 (two) times daily.     [provider]  escitalopram (LEXAPRO) 10 MG tablet Take 10 mg by mouth every evening.    [provider]  fluconazole (DIFLUCAN) 150 MG tablet Take 1 tablet (150 mg total) by mouth once. 01/02/16   Evelina Dun A, FNP  fluticasone (FLOVENT HFA) 110 MCG/ACT inhaler Inhale 2 puffs into the lungs every evening.    [provider]  HYDROcodone-acetaminophen (NORCO) 5-325 MG per tablet Take 1-2 tablets by mouth every 6 (six) hours as needed for pain. 08/24/12   Jerrell Belfast, MD  levocetirizine (XYZAL) 5 MG tablet Take 5 mg by mouth every evening.    [provider]   Lifitegrast Shirley Friar) 5 % SOLN Place 1 drop into both eyes 2 (two) times daily. 01/23/21     Lifitegrast 5 % SOLN INSTILL 1 DROP INTO [facility] EYE TWICE A DAY 07/27/20 07/27/21  Marica Otter, OD  methocarbamol (ROBAXIN) 500 MG tablet Take 500 mg by mouth 4 (four) times daily.    [provider]  naproxen (NAPROSYN) 500 MG tablet Take 500 mg by mouth 2 (two) times daily as needed. pain    [provider]  norethindrone-ethinyl estradiol (TRIPHASIL,CYCLAFEM,ALYACEN) 0.5/0.75/1-35 MG-MCG tablet Take 1 tablet by mouth daily.    [provider]  omeprazole (PRILOSEC) 20 MG capsule Take 20 mg by mouth daily.    [provider]  ondansetron (ZOFRAN) 8 MG tablet Take 1 tablet (8 mg total) by mouth every 4 (four) hours as needed for nausea. 08/10/12   Paz, Lisette, PA-C  predniSONE (DELTASONE) 10 MG tablet Take 10 mg by mouth daily.    [provider]  Probiotic Product (ALIGN) 4 MG CAPS Take 1 capsule by mouth daily.    [provider]  thyroid (ARMOUR) 120 MG tablet TAKE 1 TABLET BY MOUTH ONCE DAILY IN THE MORNING ON AN EMPTY STOMACH. 10/12/20 10/12/21  Addison, Bonney Roussel, FNP  vitamin B-12 (CYANOCOBALAMIN) 1000 MCG tablet Take 2,000 mcg by mouth daily. Take two tablets once daily    [provider]  zolpidem (AMBIEN CR) 6.25 MG CR tablet Take 6.25 mg by mouth at bedtime as needed. sleep    [provider]    Allergies    Remicade [infliximab], Omnicef [cefdinir], Sulfonamide derivatives, Avelox [moxifloxacin hcl in nacl], Levaquin [levofloxacin in d5w], and Tetracycline  Review of Systems   Review of Systems  Constitutional:  Negative for chills, diaphoresis, fatigue and fever.  Eyes:  Negative for visual disturbance.  Respiratory:  Negative for chest tightness, shortness of breath and wheezing.   Cardiovascular:  Negative for chest pain and palpitations.  Gastrointestinal:  Negative for abdominal pain, constipation, diarrhea, nausea and  vomiting.  Genitourinary:  Negative for dysuria and flank pain.  Musculoskeletal:  Negative for neck pain and neck stiffness.  Skin:  Positive for wound (brasions). Negative for rash.  Neurological:  Positive for light-headedness and headaches (improved). Negative for dizziness, seizures, syncope, speech difficulty, weakness and numbness.  Psychiatric/Behavioral:  Positive for confusion (resolved). Negative for agitation.   All other systems reviewed and are negative.  Physical Exam Updated Vital Signs BP 101/73 (BP Location: Left Arm)   Pulse (!) 104   Temp 98.1 F (36.7 C)   Resp 20   Ht '5\' 5"'$  (1.651 m)   Wt 59 kg   SpO2 100%   BMI 21.63 kg/m   Physical Exam Vitals and nursing note reviewed.  Constitutional:  General: She is not in acute distress.    Appearance: She is well-developed. She is not ill-appearing or toxic-appearing.  HENT:     Head: Normocephalic and atraumatic.     Right Ear: Tympanic membrane, ear canal and external ear normal.     Left Ear: Tympanic membrane, ear canal and external ear normal.     Nose: No congestion or rhinorrhea.     Mouth/Throat:     Mouth: Mucous membranes are moist.     Pharynx: No oropharyngeal exudate or posterior oropharyngeal erythema.  Eyes:     General: No scleral icterus.    Extraocular Movements: Extraocular movements intact.     Conjunctiva/sclera: Conjunctivae normal.     Pupils: Pupils are equal, round, and reactive to light.  Cardiovascular:     Rate and Rhythm: Normal rate and regular rhythm.     Heart sounds: No murmur heard. Pulmonary:     Effort: Pulmonary effort is normal. No respiratory distress.     Breath sounds: Normal breath sounds. No wheezing, rhonchi or rales.  Chest:     Chest wall: No tenderness.  Abdominal:     General: Abdomen is flat.     Palpations: Abdomen is soft.     Tenderness: There is no abdominal tenderness.  Musculoskeletal:        General: Tenderness and signs of injury present.      Cervical back: Neck supple. No tenderness.  Skin:    General: Skin is warm and dry.     Capillary Refill: Capillary refill takes less than 2 seconds.     Findings: No erythema or rash.     Comments: Abrasions  Neurological:     General: No focal deficit present.     Mental Status: She is alert and oriented to person, place, and time. Mental status is at baseline.     Cranial Nerves: No cranial nerve deficit.     Sensory: No sensory deficit.     Motor: No weakness.  Psychiatric:        Mood and Affect: Mood normal.    ED Results / Procedures / Treatments   Labs (all labs ordered are listed, but only abnormal results are displayed) Labs Reviewed - No data to display  EKG None  Radiology DG Shoulder Right  Result Date: 04/06/2021 CLINICAL DATA:  Pt states she fell hiking today. Right wrist and shoulder pain EXAM: RIGHT SHOULDER - 2+ VIEW COMPARISON:  None. FINDINGS: There is no evidence of fracture or dislocation. There is no evidence of arthropathy or other focal bone abnormality. Soft tissues are unremarkable. IMPRESSION: Negative. Electronically Signed   By: Audie Pinto M.D.   On: 04/06/2021 14:42   DG Wrist Complete Right  Result Date: 04/06/2021 CLINICAL DATA:  Pt states she fell hiking today. Right wrist and shoulder pain EXAM: RIGHT WRIST - COMPLETE 3+ VIEW COMPARISON:  None. FINDINGS: There is no evidence of fracture or dislocation. There is no evidence of arthropathy or other focal bone abnormality. Soft tissues are unremarkable. IMPRESSION: Negative. Electronically Signed   By: Audie Pinto M.D.   On: 04/06/2021 14:41    Procedures Procedures   Medications Ordered in ED Medications - No data to display  ED Course  I have reviewed the triage vital signs and the nursing notes.  Pertinent labs & imaging results that were available during my care of the patient were reviewed by me and considered in my medical decision making (see chart for details).  MDM  Rules/Calculators/A&P                           CARMITA VERSTEEG is a 50 y.o. female with a past medical history significant for Crohn's, asthma, anxiety, chronic sinusitis, and headaches who presents with a fall.  Patient reports she was hiking this morning at around 11:30 AM when she had a fall tripping on a root and hitting her right face, right shoulder, wrist, right leg on the ground.  She sustained abrasions and shortly after the fall was dazed for around a minute.  Family was unsure if she lost consciousness but was acting different for at least a minute or 2 before getting back to her baseline.  Patient has been subsequently resting with only mild to moderate pain in her wrist/hand but improving symptoms in the head and shoulder.  She is right-handed.  She denies any history of previous significant head injury.  She denies any nausea, vomiting, vision changes, speech difficulties or other neurologic complaints.  Denies any pain in her neck.  Denies any sensation that her bite is different from jaw injury.  Patient went to urgent care initially and was told to come to emergency department evaluation in the event of possible head injury given the transient altered mental status after the fall.  On exam, lungs are clear and chest is nontender.  Shoulder is tender to the touch but otherwise had normal range of motion and she can touch her right hand to her left shoulder.  No laceration.  Intact sensation, strength, and pulses in the bilateral wrists.  Patient had normal wrist range of motion but some tenderness in the thenar eminence on the palmar side of her hand.  She had normal range of motion of the thumb and had no true snuffbox tenderness.  Normal capillary refill, sensation, and strength of the fingers.  Abrasion on the right thigh but otherwise no tenderness in the hip.  Exam otherwise unremarkable with no focal neurologic deficits.  Had a long shared decision-making conversation with patient and  husband about work-up.  She had an x-ray of the wrist which did not show fracture, will give a brace and hand surgery follow-up if symptoms persist as we discussed the possibility of occult injury.  We also discussed getting a head CT however given her stability for nearly 4 hours without any concerning red flags of current head injury with no nausea vomiting, vision changes, worsening headache, or other complaints, we agreed together to hold on head CT at this time.  They understand extremely strict return precautions for any new or worsened symptoms to come back for likely head CT.  We discussed her symptoms are likely post concussive in nature.  She also discussed that she has had waxing waning sinusitis troubles on and off and will follow-up with her ENT and PCP for this.  Patient with questions or concerns and was discharged in good condition without any other complaints.   Final Clinical Impression(s) / ED Diagnoses Final diagnoses:  Activities involving walking, marching and hiking  Fall, initial encounter  Multiple abrasions  Right hand pain  Acute pain of right shoulder  Transient alteration of awareness  Concussion with loss of consciousness, initial encounter    Rx / DC Orders ED Discharge Orders     None       Clinical Impression: 1. Activities involving walking, marching and hiking   2. Fall, initial encounter  3. Multiple abrasions   4. Right hand pain   5. Acute pain of right shoulder   6. Transient alteration of awareness   7. Concussion with loss of consciousness, initial encounter     Disposition: Discharge  Condition: Good  I have discussed the results, Dx and Tx plan with the pt(& family if present). He/she/they expressed understanding and agree(s) with the plan. Discharge instructions discussed at great length. Strict return precautions discussed and pt &/or family have verbalized understanding of the instructions. No further questions at time of discharge.     New Prescriptions   No medications on file    Follow Up: Cindra Presume, [place] [id number] [phone]   with hand surgery  Darcus Austin, MD [address] [place] [address] [facility] [place] [phone]       Tomasina Keasling, Gwenyth Allegra, MD 04/06/21 [phone]

## 2021-04-08 DIAGNOSIS — J069 Acute upper respiratory infection, unspecified: Secondary | ICD-10-CM | POA: Diagnosis not present

## 2021-04-08 DIAGNOSIS — R0981 Nasal congestion: Secondary | ICD-10-CM | POA: Diagnosis not present

## 2021-04-08 DIAGNOSIS — J0100 Acute maxillary sinusitis, unspecified: Secondary | ICD-10-CM | POA: Diagnosis not present

## 2021-04-15 ENCOUNTER — Other Ambulatory Visit (HOSPITAL_COMMUNITY): Payer: Self-pay

## 2021-04-15 MED ORDER — NP THYROID 120 MG PO TABS
120.0000 mg | ORAL_TABLET | Freq: Every morning | ORAL | 0 refills | Status: AC
  Filled 2021-04-15: qty 30, 30d supply, fill #0

## 2021-04-23 ENCOUNTER — Other Ambulatory Visit (HOSPITAL_COMMUNITY): Payer: Self-pay

## 2021-05-21 ENCOUNTER — Other Ambulatory Visit (HOSPITAL_COMMUNITY): Payer: Self-pay

## 2021-07-03 DIAGNOSIS — F419 Anxiety disorder, unspecified: Secondary | ICD-10-CM | POA: Diagnosis not present

## 2021-07-03 DIAGNOSIS — E559 Vitamin D deficiency, unspecified: Secondary | ICD-10-CM | POA: Diagnosis not present

## 2021-07-03 DIAGNOSIS — R5383 Other fatigue: Secondary | ICD-10-CM | POA: Diagnosis not present

## 2021-07-03 DIAGNOSIS — K5090 Crohn's disease, unspecified, without complications: Secondary | ICD-10-CM | POA: Diagnosis not present

## 2021-07-03 DIAGNOSIS — E039 Hypothyroidism, unspecified: Secondary | ICD-10-CM | POA: Diagnosis not present

## 2021-07-10 ENCOUNTER — Other Ambulatory Visit (HOSPITAL_COMMUNITY): Payer: Self-pay

## 2021-08-06 IMAGING — DX DG WRIST COMPLETE 3+V*R*
4 series · 4 of 4 positions shown · non-contrast
Comparison: None.

CLINICAL DATA: Pt states she fell hiking today. Right wrist and
shoulder pain

EXAM:
RIGHT WRIST - COMPLETE 3+ VIEW

[wrist ap]
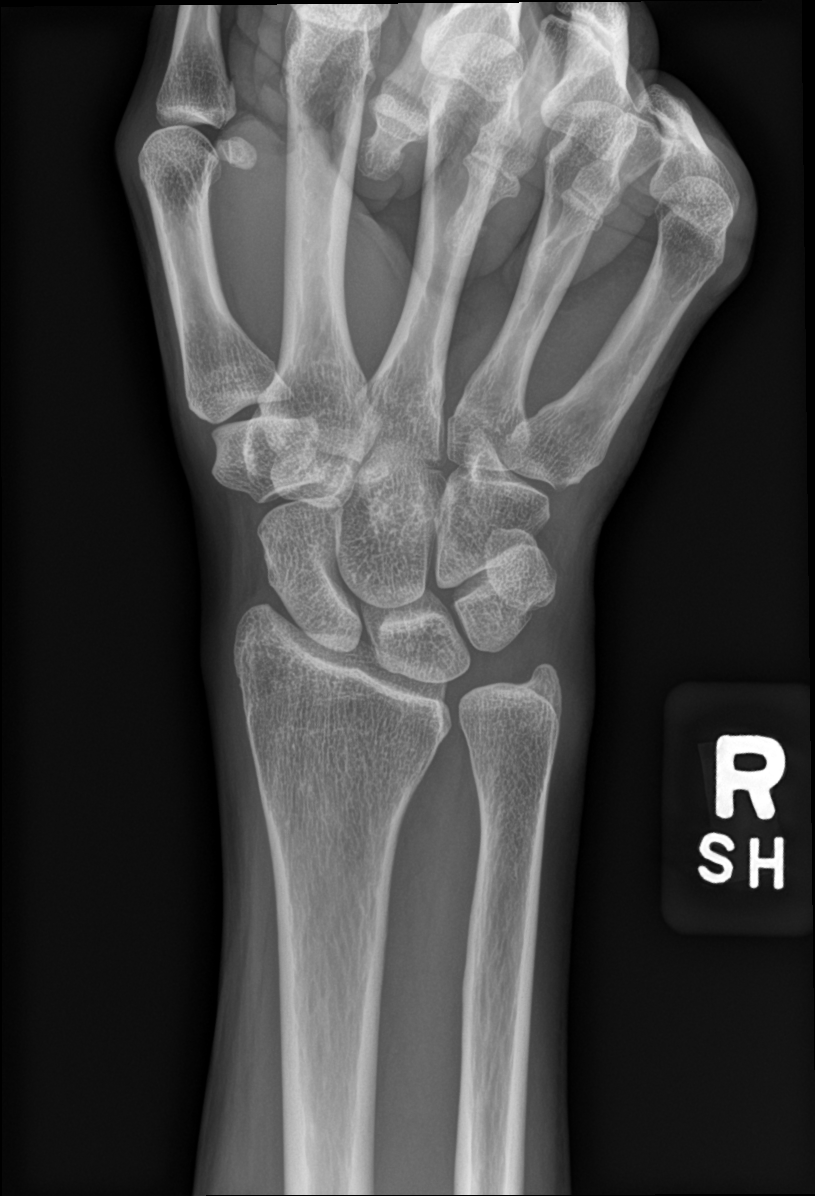

[wrist obl]
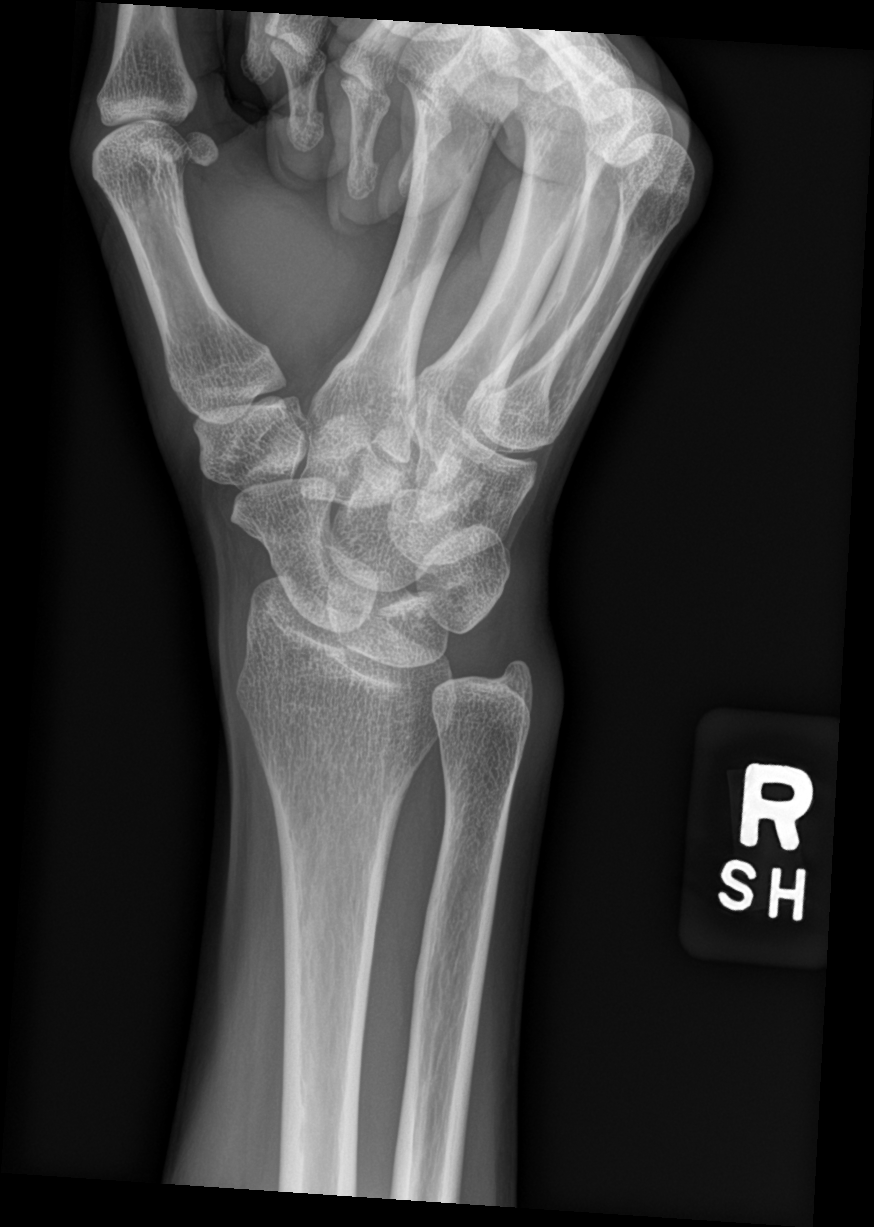

[wrist lat]
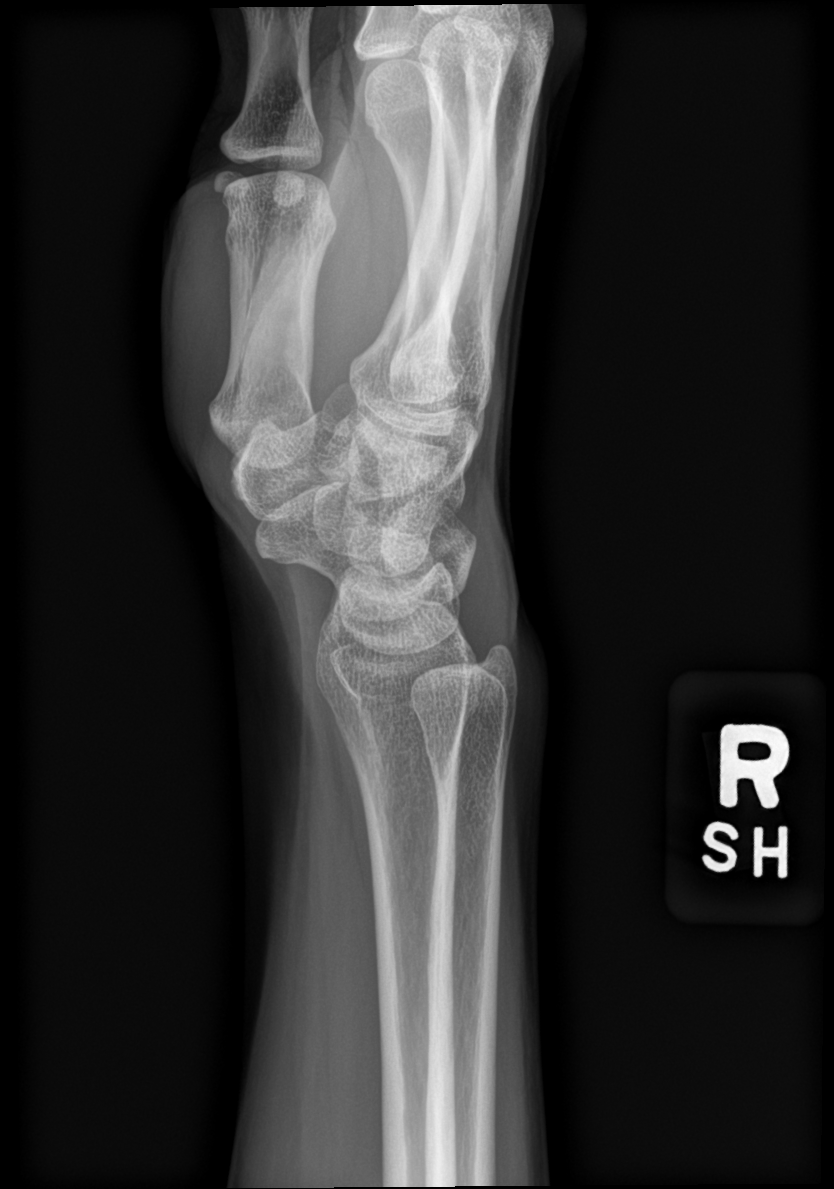

[wrist navicular]
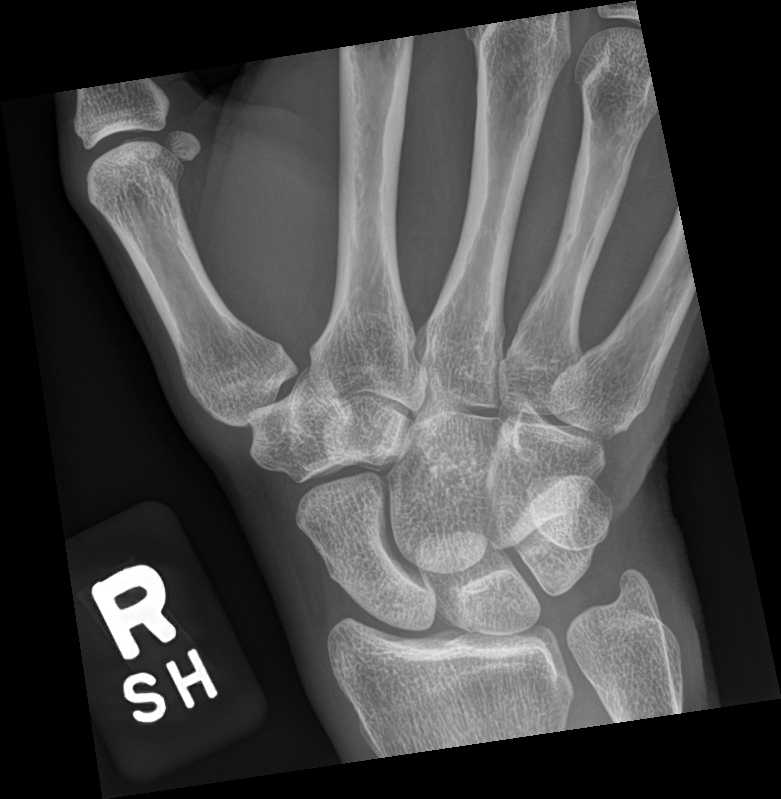

[4 of 4 positions shown; findings below may reference images not displayed]

FINDINGS: There is no evidence of fracture or dislocation. There is no
evidence of arthropathy or other focal bone abnormality. Soft
tissues are unremarkable.
IMPRESSION: Negative.

## 2021-11-05 DIAGNOSIS — B029 Zoster without complications: Secondary | ICD-10-CM | POA: Diagnosis not present

## 2021-11-05 DIAGNOSIS — K5010 Crohn's disease of large intestine without complications: Secondary | ICD-10-CM | POA: Diagnosis not present

## 2021-11-06 DIAGNOSIS — J3089 Other allergic rhinitis: Secondary | ICD-10-CM | POA: Diagnosis not present

## 2021-11-06 DIAGNOSIS — J4530 Mild persistent asthma, uncomplicated: Secondary | ICD-10-CM | POA: Diagnosis not present

## 2021-11-06 DIAGNOSIS — J3081 Allergic rhinitis due to animal (cat) (dog) hair and dander: Secondary | ICD-10-CM | POA: Diagnosis not present

## 2022-01-29 DIAGNOSIS — Z803 Family history of malignant neoplasm of breast: Secondary | ICD-10-CM | POA: Diagnosis not present

## 2022-01-29 DIAGNOSIS — Z1231 Encounter for screening mammogram for malignant neoplasm of breast: Secondary | ICD-10-CM | POA: Diagnosis not present

## 2022-01-29 DIAGNOSIS — Z01419 Encounter for gynecological examination (general) (routine) without abnormal findings: Secondary | ICD-10-CM | POA: Diagnosis not present

## 2022-01-29 DIAGNOSIS — Z1211 Encounter for screening for malignant neoplasm of colon: Secondary | ICD-10-CM | POA: Diagnosis not present

## 2022-01-29 DIAGNOSIS — Z3040 Encounter for surveillance of contraceptives, unspecified: Secondary | ICD-10-CM | POA: Diagnosis not present

## 2022-01-29 DIAGNOSIS — N393 Stress incontinence (female) (male): Secondary | ICD-10-CM | POA: Diagnosis not present

## 2022-02-05 DIAGNOSIS — K5010 Crohn's disease of large intestine without complications: Secondary | ICD-10-CM | POA: Diagnosis not present

## 2022-02-19 DIAGNOSIS — Z1371 Encounter for nonprocreative screening for genetic disease carrier status: Secondary | ICD-10-CM | POA: Diagnosis not present

## 2022-02-19 DIAGNOSIS — Z800 Family history of malignant neoplasm of digestive organs: Secondary | ICD-10-CM | POA: Diagnosis not present

## 2022-02-19 DIAGNOSIS — Z803 Family history of malignant neoplasm of breast: Secondary | ICD-10-CM | POA: Diagnosis not present

## 2022-02-19 DIAGNOSIS — Z809 Family history of malignant neoplasm, unspecified: Secondary | ICD-10-CM | POA: Diagnosis not present

## 2022-03-03 ENCOUNTER — Other Ambulatory Visit (HOSPITAL_COMMUNITY): Payer: Self-pay

## 2022-03-03 MED ORDER — NP THYROID 120 MG PO TABS
120.0000 mg | ORAL_TABLET | Freq: Every morning | ORAL | 0 refills | Status: AC
  Filled 2022-03-03: qty 30, 30d supply, fill #0

## 2022-03-14 ENCOUNTER — Other Ambulatory Visit (HOSPITAL_COMMUNITY): Payer: Self-pay

## 2022-04-02 DIAGNOSIS — J3081 Allergic rhinitis due to animal (cat) (dog) hair and dander: Secondary | ICD-10-CM | POA: Diagnosis not present

## 2022-04-02 DIAGNOSIS — J4530 Mild persistent asthma, uncomplicated: Secondary | ICD-10-CM | POA: Diagnosis not present

## 2022-04-02 DIAGNOSIS — J3089 Other allergic rhinitis: Secondary | ICD-10-CM | POA: Diagnosis not present

## 2022-04-04 DIAGNOSIS — R051 Acute cough: Secondary | ICD-10-CM | POA: Diagnosis not present

## 2022-04-04 DIAGNOSIS — J040 Acute laryngitis: Secondary | ICD-10-CM | POA: Diagnosis not present

## 2022-04-06 DIAGNOSIS — J0141 Acute recurrent pansinusitis: Secondary | ICD-10-CM | POA: Diagnosis not present

## 2022-04-23 DIAGNOSIS — N393 Stress incontinence (female) (male): Secondary | ICD-10-CM | POA: Diagnosis not present

## 2022-04-23 DIAGNOSIS — R3915 Urgency of urination: Secondary | ICD-10-CM | POA: Diagnosis not present

## 2022-04-23 DIAGNOSIS — R278 Other lack of coordination: Secondary | ICD-10-CM | POA: Diagnosis not present

## 2022-04-23 DIAGNOSIS — M6281 Muscle weakness (generalized): Secondary | ICD-10-CM | POA: Diagnosis not present

## 2022-04-30 DIAGNOSIS — R278 Other lack of coordination: Secondary | ICD-10-CM | POA: Diagnosis not present

## 2022-04-30 DIAGNOSIS — R3915 Urgency of urination: Secondary | ICD-10-CM | POA: Diagnosis not present

## 2022-04-30 DIAGNOSIS — N393 Stress incontinence (female) (male): Secondary | ICD-10-CM | POA: Diagnosis not present

## 2022-04-30 DIAGNOSIS — M6281 Muscle weakness (generalized): Secondary | ICD-10-CM | POA: Diagnosis not present

## 2022-05-14 DIAGNOSIS — M6281 Muscle weakness (generalized): Secondary | ICD-10-CM | POA: Diagnosis not present

## 2022-05-14 DIAGNOSIS — N393 Stress incontinence (female) (male): Secondary | ICD-10-CM | POA: Diagnosis not present

## 2022-05-14 DIAGNOSIS — R278 Other lack of coordination: Secondary | ICD-10-CM | POA: Diagnosis not present

## 2022-05-14 DIAGNOSIS — R3915 Urgency of urination: Secondary | ICD-10-CM | POA: Diagnosis not present

## 2022-06-03 DIAGNOSIS — R5383 Other fatigue: Secondary | ICD-10-CM | POA: Diagnosis not present

## 2022-06-03 DIAGNOSIS — E039 Hypothyroidism, unspecified: Secondary | ICD-10-CM | POA: Diagnosis not present

## 2022-06-03 DIAGNOSIS — F419 Anxiety disorder, unspecified: Secondary | ICD-10-CM | POA: Diagnosis not present

## 2022-06-03 DIAGNOSIS — E559 Vitamin D deficiency, unspecified: Secondary | ICD-10-CM | POA: Diagnosis not present

## 2022-06-03 DIAGNOSIS — K5090 Crohn's disease, unspecified, without complications: Secondary | ICD-10-CM | POA: Diagnosis not present

## 2022-07-02 DIAGNOSIS — E039 Hypothyroidism, unspecified: Secondary | ICD-10-CM | POA: Diagnosis not present

## 2022-07-02 DIAGNOSIS — E559 Vitamin D deficiency, unspecified: Secondary | ICD-10-CM | POA: Diagnosis not present

## 2022-07-02 DIAGNOSIS — F419 Anxiety disorder, unspecified: Secondary | ICD-10-CM | POA: Diagnosis not present

## 2022-07-02 DIAGNOSIS — R5383 Other fatigue: Secondary | ICD-10-CM | POA: Diagnosis not present

## 2022-07-09 DIAGNOSIS — M26629 Arthralgia of temporomandibular joint, unspecified side: Secondary | ICD-10-CM | POA: Diagnosis not present

## 2022-08-30 DIAGNOSIS — J0101 Acute recurrent maxillary sinusitis: Secondary | ICD-10-CM | POA: Diagnosis not present

## 2023-01-12 DIAGNOSIS — Z0000 Encounter for general adult medical examination without abnormal findings: Secondary | ICD-10-CM | POA: Diagnosis not present

## 2023-01-12 DIAGNOSIS — E0590 Thyrotoxicosis, unspecified without thyrotoxic crisis or storm: Secondary | ICD-10-CM | POA: Diagnosis not present

## 2023-01-12 DIAGNOSIS — Z131 Encounter for screening for diabetes mellitus: Secondary | ICD-10-CM | POA: Diagnosis not present

## 2023-01-12 DIAGNOSIS — Z13220 Encounter for screening for lipoid disorders: Secondary | ICD-10-CM | POA: Diagnosis not present

## 2023-01-14 DIAGNOSIS — Z23 Encounter for immunization: Secondary | ICD-10-CM | POA: Diagnosis not present

## 2023-01-14 DIAGNOSIS — M26629 Arthralgia of temporomandibular joint, unspecified side: Secondary | ICD-10-CM | POA: Diagnosis not present

## 2023-01-14 DIAGNOSIS — Z0000 Encounter for general adult medical examination without abnormal findings: Secondary | ICD-10-CM | POA: Diagnosis not present

## 2023-01-14 DIAGNOSIS — E0590 Thyrotoxicosis, unspecified without thyrotoxic crisis or storm: Secondary | ICD-10-CM | POA: Diagnosis not present

## 2023-02-09 DIAGNOSIS — R059 Cough, unspecified: Secondary | ICD-10-CM | POA: Diagnosis not present

## 2023-02-09 DIAGNOSIS — J329 Chronic sinusitis, unspecified: Secondary | ICD-10-CM | POA: Diagnosis not present

## 2023-02-09 DIAGNOSIS — Z8709 Personal history of other diseases of the respiratory system: Secondary | ICD-10-CM | POA: Diagnosis not present

## 2023-02-09 DIAGNOSIS — J40 Bronchitis, not specified as acute or chronic: Secondary | ICD-10-CM | POA: Diagnosis not present

## 2023-03-25 DIAGNOSIS — J3081 Allergic rhinitis due to animal (cat) (dog) hair and dander: Secondary | ICD-10-CM | POA: Diagnosis not present

## 2023-03-25 DIAGNOSIS — J4530 Mild persistent asthma, uncomplicated: Secondary | ICD-10-CM | POA: Diagnosis not present

## 2023-03-25 DIAGNOSIS — J3089 Other allergic rhinitis: Secondary | ICD-10-CM | POA: Diagnosis not present

## 2023-03-25 DIAGNOSIS — R21 Rash and other nonspecific skin eruption: Secondary | ICD-10-CM | POA: Diagnosis not present

## 2023-06-24 DIAGNOSIS — Z01419 Encounter for gynecological examination (general) (routine) without abnormal findings: Secondary | ICD-10-CM | POA: Diagnosis not present

## 2023-06-24 DIAGNOSIS — Z1330 Encounter for screening examination for mental health and behavioral disorders, unspecified: Secondary | ICD-10-CM | POA: Diagnosis not present

## 2023-06-24 DIAGNOSIS — Z1231 Encounter for screening mammogram for malignant neoplasm of breast: Secondary | ICD-10-CM | POA: Diagnosis not present

## 2023-07-08 DIAGNOSIS — E538 Deficiency of other specified B group vitamins: Secondary | ICD-10-CM | POA: Diagnosis not present

## 2023-07-08 DIAGNOSIS — Z1329 Encounter for screening for other suspected endocrine disorder: Secondary | ICD-10-CM | POA: Diagnosis not present

## 2023-07-08 DIAGNOSIS — F419 Anxiety disorder, unspecified: Secondary | ICD-10-CM | POA: Diagnosis not present

## 2023-07-08 DIAGNOSIS — Z131 Encounter for screening for diabetes mellitus: Secondary | ICD-10-CM | POA: Diagnosis not present

## 2023-07-08 DIAGNOSIS — E039 Hypothyroidism, unspecified: Secondary | ICD-10-CM | POA: Diagnosis not present

## 2023-07-08 DIAGNOSIS — R5383 Other fatigue: Secondary | ICD-10-CM | POA: Diagnosis not present

## 2023-07-08 DIAGNOSIS — E559 Vitamin D deficiency, unspecified: Secondary | ICD-10-CM | POA: Diagnosis not present

## 2023-07-14 DIAGNOSIS — S86111D Strain of other muscle(s) and tendon(s) of posterior muscle group at lower leg level, right leg, subsequent encounter: Secondary | ICD-10-CM | POA: Diagnosis not present

## 2023-07-15 DIAGNOSIS — S86111D Strain of other muscle(s) and tendon(s) of posterior muscle group at lower leg level, right leg, subsequent encounter: Secondary | ICD-10-CM | POA: Diagnosis not present

## 2023-07-15 DIAGNOSIS — Z780 Asymptomatic menopausal state: Secondary | ICD-10-CM | POA: Diagnosis not present

## 2023-07-20 DIAGNOSIS — S86111D Strain of other muscle(s) and tendon(s) of posterior muscle group at lower leg level, right leg, subsequent encounter: Secondary | ICD-10-CM | POA: Diagnosis not present

## 2023-07-22 DIAGNOSIS — M26629 Arthralgia of temporomandibular joint, unspecified side: Secondary | ICD-10-CM | POA: Diagnosis not present

## 2023-07-24 DIAGNOSIS — S86111D Strain of other muscle(s) and tendon(s) of posterior muscle group at lower leg level, right leg, subsequent encounter: Secondary | ICD-10-CM | POA: Diagnosis not present

## 2023-07-27 DIAGNOSIS — S86111D Strain of other muscle(s) and tendon(s) of posterior muscle group at lower leg level, right leg, subsequent encounter: Secondary | ICD-10-CM | POA: Diagnosis not present

## 2023-07-30 DIAGNOSIS — S86111D Strain of other muscle(s) and tendon(s) of posterior muscle group at lower leg level, right leg, subsequent encounter: Secondary | ICD-10-CM | POA: Diagnosis not present

## 2023-07-31 DIAGNOSIS — K5010 Crohn's disease of large intestine without complications: Secondary | ICD-10-CM | POA: Diagnosis not present

## 2023-07-31 DIAGNOSIS — R140 Abdominal distension (gaseous): Secondary | ICD-10-CM | POA: Diagnosis not present

## 2023-08-04 DIAGNOSIS — S86111D Strain of other muscle(s) and tendon(s) of posterior muscle group at lower leg level, right leg, subsequent encounter: Secondary | ICD-10-CM | POA: Diagnosis not present

## 2023-08-13 DIAGNOSIS — S86111D Strain of other muscle(s) and tendon(s) of posterior muscle group at lower leg level, right leg, subsequent encounter: Secondary | ICD-10-CM | POA: Diagnosis not present

## 2023-08-20 DIAGNOSIS — S86111D Strain of other muscle(s) and tendon(s) of posterior muscle group at lower leg level, right leg, subsequent encounter: Secondary | ICD-10-CM | POA: Diagnosis not present

## 2023-08-26 DIAGNOSIS — S86111D Strain of other muscle(s) and tendon(s) of posterior muscle group at lower leg level, right leg, subsequent encounter: Secondary | ICD-10-CM | POA: Diagnosis not present

## 2023-09-04 DIAGNOSIS — S86111D Strain of other muscle(s) and tendon(s) of posterior muscle group at lower leg level, right leg, subsequent encounter: Secondary | ICD-10-CM | POA: Diagnosis not present

## 2023-09-08 DIAGNOSIS — S86111D Strain of other muscle(s) and tendon(s) of posterior muscle group at lower leg level, right leg, subsequent encounter: Secondary | ICD-10-CM | POA: Diagnosis not present

## 2023-09-11 DIAGNOSIS — S86111D Strain of other muscle(s) and tendon(s) of posterior muscle group at lower leg level, right leg, subsequent encounter: Secondary | ICD-10-CM | POA: Diagnosis not present

## 2023-10-15 DIAGNOSIS — F5101 Primary insomnia: Secondary | ICD-10-CM | POA: Diagnosis not present

## 2023-10-15 DIAGNOSIS — M26629 Arthralgia of temporomandibular joint, unspecified side: Secondary | ICD-10-CM | POA: Diagnosis not present

## 2024-02-03 DIAGNOSIS — Z0000 Encounter for general adult medical examination without abnormal findings: Secondary | ICD-10-CM | POA: Diagnosis not present

## 2024-02-05 DIAGNOSIS — E0590 Thyrotoxicosis, unspecified without thyrotoxic crisis or storm: Secondary | ICD-10-CM | POA: Diagnosis not present

## 2024-02-05 DIAGNOSIS — M26629 Arthralgia of temporomandibular joint, unspecified side: Secondary | ICD-10-CM | POA: Diagnosis not present

## 2024-02-05 DIAGNOSIS — Z0000 Encounter for general adult medical examination without abnormal findings: Secondary | ICD-10-CM | POA: Diagnosis not present

## 2024-02-10 DIAGNOSIS — Z0184 Encounter for antibody response examination: Secondary | ICD-10-CM | POA: Diagnosis not present

## 2024-04-05 ENCOUNTER — Other Ambulatory Visit: Payer: Self-pay | Admitting: Obstetrics and Gynecology

## 2024-04-05 DIAGNOSIS — Z1231 Encounter for screening mammogram for malignant neoplasm of breast: Secondary | ICD-10-CM

## 2024-04-06 DIAGNOSIS — J4530 Mild persistent asthma, uncomplicated: Secondary | ICD-10-CM | POA: Diagnosis not present

## 2024-04-06 DIAGNOSIS — J3089 Other allergic rhinitis: Secondary | ICD-10-CM | POA: Diagnosis not present

## 2024-04-06 DIAGNOSIS — R21 Rash and other nonspecific skin eruption: Secondary | ICD-10-CM | POA: Diagnosis not present

## 2024-04-06 DIAGNOSIS — J3081 Allergic rhinitis due to animal (cat) (dog) hair and dander: Secondary | ICD-10-CM | POA: Diagnosis not present

## 2024-05-25 DIAGNOSIS — H04123 Dry eye syndrome of bilateral lacrimal glands: Secondary | ICD-10-CM | POA: Diagnosis not present

## 2024-05-25 DIAGNOSIS — H25033 Anterior subcapsular polar age-related cataract, bilateral: Secondary | ICD-10-CM | POA: Diagnosis not present

## 2024-05-27 DIAGNOSIS — R0981 Nasal congestion: Secondary | ICD-10-CM | POA: Diagnosis not present

## 2024-05-27 DIAGNOSIS — U071 COVID-19: Secondary | ICD-10-CM | POA: Diagnosis not present

## 2024-05-27 DIAGNOSIS — R051 Acute cough: Secondary | ICD-10-CM | POA: Diagnosis not present

## 2024-06-01 DIAGNOSIS — R490 Dysphonia: Secondary | ICD-10-CM | POA: Diagnosis not present

## 2024-06-01 DIAGNOSIS — K219 Gastro-esophageal reflux disease without esophagitis: Secondary | ICD-10-CM | POA: Diagnosis not present

## 2024-06-01 DIAGNOSIS — K5010 Crohn's disease of large intestine without complications: Secondary | ICD-10-CM | POA: Diagnosis not present

## 2024-07-13 DIAGNOSIS — Z1330 Encounter for screening examination for mental health and behavioral disorders, unspecified: Secondary | ICD-10-CM | POA: Diagnosis not present

## 2024-07-13 DIAGNOSIS — Z124 Encounter for screening for malignant neoplasm of cervix: Secondary | ICD-10-CM | POA: Diagnosis not present

## 2024-07-13 DIAGNOSIS — Z01419 Encounter for gynecological examination (general) (routine) without abnormal findings: Secondary | ICD-10-CM | POA: Diagnosis not present

## 2024-07-22 DIAGNOSIS — K5289 Other specified noninfective gastroenteritis and colitis: Secondary | ICD-10-CM | POA: Diagnosis not present

## 2024-07-22 DIAGNOSIS — R490 Dysphonia: Secondary | ICD-10-CM | POA: Diagnosis not present

## 2024-07-22 DIAGNOSIS — K449 Diaphragmatic hernia without obstruction or gangrene: Secondary | ICD-10-CM | POA: Diagnosis not present

## 2024-07-22 DIAGNOSIS — K2930 Chronic superficial gastritis without bleeding: Secondary | ICD-10-CM | POA: Diagnosis not present

## 2024-07-22 DIAGNOSIS — K2970 Gastritis, unspecified, without bleeding: Secondary | ICD-10-CM | POA: Diagnosis not present

## 2024-07-22 DIAGNOSIS — K5010 Crohn's disease of large intestine without complications: Secondary | ICD-10-CM | POA: Diagnosis not present

## 2024-07-25 DIAGNOSIS — H25043 Posterior subcapsular polar age-related cataract, bilateral: Secondary | ICD-10-CM | POA: Diagnosis not present

## 2024-07-25 DIAGNOSIS — H52203 Unspecified astigmatism, bilateral: Secondary | ICD-10-CM | POA: Diagnosis not present

## 2024-08-18 DIAGNOSIS — F418 Other specified anxiety disorders: Secondary | ICD-10-CM | POA: Diagnosis not present

## 2024-08-18 DIAGNOSIS — H25811 Combined forms of age-related cataract, right eye: Secondary | ICD-10-CM | POA: Diagnosis not present

## 2024-08-18 DIAGNOSIS — H25041 Posterior subcapsular polar age-related cataract, right eye: Secondary | ICD-10-CM | POA: Diagnosis not present

## 2024-08-25 DIAGNOSIS — F418 Other specified anxiety disorders: Secondary | ICD-10-CM | POA: Diagnosis not present

## 2024-08-25 DIAGNOSIS — H25812 Combined forms of age-related cataract, left eye: Secondary | ICD-10-CM | POA: Diagnosis not present

## 2024-08-25 DIAGNOSIS — H25042 Posterior subcapsular polar age-related cataract, left eye: Secondary | ICD-10-CM | POA: Diagnosis not present
